# Patient Record
Sex: Female | Born: 1965 | Race: Black or African American | Hispanic: No | Marital: Married | Smoking: Never smoker
Health system: Southern US, Community
[De-identification: ages and names within clinical notes are randomized; demographics above are authoritative.]

## PROBLEM LIST (undated history)

## (undated) DIAGNOSIS — T7840XA Allergy, unspecified, initial encounter: Secondary | ICD-10-CM

## (undated) DIAGNOSIS — L509 Urticaria, unspecified: Secondary | ICD-10-CM

## (undated) HISTORY — DX: Urticaria, unspecified: L50.9

## (undated) HISTORY — DX: Allergy, unspecified, initial encounter: T78.40XA

---

## 1998-11-19 ENCOUNTER — Other Ambulatory Visit: Admission: RE | Admit: 1998-11-19 | Discharge: 1998-11-19 | Payer: Self-pay | Admitting: Obstetrics and Gynecology

## 1999-06-13 ENCOUNTER — Inpatient Hospital Stay (HOSPITAL_COMMUNITY): Admission: AD | Admit: 1999-06-13 | Discharge: 1999-06-16 | Payer: Self-pay | Admitting: Obstetrics and Gynecology

## 1999-06-13 ENCOUNTER — Encounter (INDEPENDENT_AMBULATORY_CARE_PROVIDER_SITE_OTHER): Payer: Self-pay | Admitting: Specialist

## 1999-06-18 ENCOUNTER — Encounter: Admission: RE | Admit: 1999-06-18 | Discharge: 1999-09-16 | Payer: Self-pay | Admitting: Obstetrics and Gynecology

## 1999-08-06 ENCOUNTER — Other Ambulatory Visit: Admission: RE | Admit: 1999-08-06 | Discharge: 1999-08-06 | Payer: Self-pay | Admitting: Obstetrics and Gynecology

## 2001-01-19 ENCOUNTER — Other Ambulatory Visit: Admission: RE | Admit: 2001-01-19 | Discharge: 2001-01-19 | Payer: Self-pay | Admitting: *Deleted

## 2002-04-12 ENCOUNTER — Other Ambulatory Visit: Admission: RE | Admit: 2002-04-12 | Discharge: 2002-04-12 | Payer: Self-pay | Admitting: *Deleted

## 2003-05-06 ENCOUNTER — Other Ambulatory Visit: Admission: RE | Admit: 2003-05-06 | Discharge: 2003-05-06 | Payer: Self-pay | Admitting: *Deleted

## 2004-04-14 ENCOUNTER — Other Ambulatory Visit: Admission: RE | Admit: 2004-04-14 | Discharge: 2004-04-14 | Payer: Self-pay | Admitting: Obstetrics and Gynecology

## 2004-10-02 ENCOUNTER — Encounter (INDEPENDENT_AMBULATORY_CARE_PROVIDER_SITE_OTHER): Payer: Self-pay | Admitting: Specialist

## 2004-10-02 ENCOUNTER — Inpatient Hospital Stay (HOSPITAL_COMMUNITY): Admission: RE | Admit: 2004-10-02 | Discharge: 2004-10-05 | Payer: Self-pay | Admitting: Obstetrics and Gynecology

## 2004-11-12 ENCOUNTER — Other Ambulatory Visit: Admission: RE | Admit: 2004-11-12 | Discharge: 2004-11-12 | Payer: Self-pay | Admitting: Obstetrics and Gynecology

## 2005-02-22 ENCOUNTER — Encounter (HOSPITAL_COMMUNITY): Admission: RE | Admit: 2005-02-22 | Discharge: 2005-03-24 | Payer: Self-pay | Admitting: Obstetrics and Gynecology

## 2005-12-13 ENCOUNTER — Other Ambulatory Visit: Admission: RE | Admit: 2005-12-13 | Discharge: 2005-12-13 | Payer: Self-pay | Admitting: Obstetrics and Gynecology

## 2007-03-13 ENCOUNTER — Other Ambulatory Visit: Admission: RE | Admit: 2007-03-13 | Discharge: 2007-03-13 | Payer: Self-pay | Admitting: Obstetrics and Gynecology

## 2008-11-25 ENCOUNTER — Other Ambulatory Visit: Admission: RE | Admit: 2008-11-25 | Discharge: 2008-11-25 | Payer: Self-pay | Admitting: Obstetrics and Gynecology

## 2008-12-24 ENCOUNTER — Encounter: Admission: RE | Admit: 2008-12-24 | Discharge: 2008-12-24 | Payer: Self-pay | Admitting: Obstetrics and Gynecology

## 2009-10-29 ENCOUNTER — Encounter: Admission: RE | Admit: 2009-10-29 | Discharge: 2009-10-29 | Payer: Self-pay | Admitting: Obstetrics and Gynecology

## 2009-12-23 ENCOUNTER — Other Ambulatory Visit: Admission: RE | Admit: 2009-12-23 | Discharge: 2009-12-23 | Payer: Self-pay | Admitting: Obstetrics and Gynecology

## 2010-02-06 ENCOUNTER — Encounter: Admission: RE | Admit: 2010-02-06 | Discharge: 2010-02-06 | Payer: Self-pay | Admitting: Obstetrics and Gynecology

## 2010-09-18 NOTE — Discharge Summary (Signed)
[facility]  Patient:    [name], [name]                       MRN: [id number] Adm. Date:  [id number] Disc. Date: [id number] Attending:  Madelyn Flavors Dictator:   Danie Chandler, R.N.                           Discharge Summary  ADMISSION DIAGNOSES:          1. Intrauterine pregnancy at term.                               2. For induction of labor.  DISCHARGE DIAGNOSES:          1. Intrauterine pregnancy at term.                               2. For induction of labor.                               3. Fetal intolerance to labor.  PROCEDURE:                    On June 13, 1999, primary low transverse cesarean section.  REASON FOR ADMISSION:         The patient is a 45 year old, gravida 1, para 0, ith an estimated date of confinement of May 29, 1999, placing her at 40+ weeks gestation.  The patient was admitted for induction of labor.  Artificial rupture of membranes was carried out revealing clear fluid.  The fetal heart was reactive, but there were three episodes of deep variables which resolved and there was good variability and accelleration.  The patient was started on Pitocin augmentation and that afternoon around 4:30 the heart rate decreased to the 60s times approximately one minute with recovery and there were good accellerations and variability. Once again around 5:45 p.m. the heart rate decreased for approximately 1-1/2 minutes to the 70s and then times 1 minute to the 70s to 80s.  There was good recovery and the fetal heart was reactive with good accellerations and variability.  The cervix t that time was 3 cm, 70%, and vertex at -2.  It was explained to the patient that it may be necessary to do cesarean section if variables became frequent.  At approximately 9 p.m. the fetal heart rate decreased to the 50s x 2 minutes and hen to the 70s and then recovered fully.  This preceded by three late variables with slow  returns.  It was explained to the patient and the husband that the fetus had done this four times since uterine contractions had begun.  At this time, the cervix was 5 cm, 50%, and vertex -2 to -3.  The decision was made to proceed with primary cesarean section due to the assessment that the fetus could not tolerate labor.  HOSPITAL COURSE:              The patient is taken to the operating room and undergoes the above named procedure without complications.  This is productive f a viable female infant with Apgars of 8 at one minute and 9 at five minutes and an arterial cord pH of 7.24.  Postoperatively, the patient  does well.  On postoperative day #1, the patients hemoglobin was 11.6, hematocrit 33.9, and white blood cell count 14.1.  On postoperative day #2, the patient had good control of pain and was ambulating well without difficulty.  On postoperative day #3, she ad good return of bowel function and was tolerating a regular diet.  She was discharged home on this day.  CONDITION ON DISCHARGE:       Good.  DIET:                         Regular as tolerated.  ACTIVITY:                     No heavy lifting, no driving, and no vaginal entry.  FOLLOW-UP:                    She is to follow up in the office in two weeks for incision check and she is to call for a temperature greater than 100 degrees, persistent nausea and vomiting, heavy vaginal bleeding, and/or redness or drainage from the incision site.  DISCHARGE MEDICATIONS:        1. Prenatal vitamins one p.o. q.d.                               2. Motrin 800 mg one p.o. q.8h. p.r.n. pain.                               3. Tylox #20 one to two p.o. q.4h. p.r.n. pain. DD:  06/29/99 TD:  06/29/99 Job: [id number]

## 2010-09-18 NOTE — H&P (Signed)
NAMEJORDYNE, [name]              ACCOUNT NO.:  [id number]   MEDICAL RECORD NO.:  [id number]          PATIENT TYPE:  INP   LOCATION:  NA                            FACILITY:  [facility]   PHYSICIAN:  Guy Sandifer. Henderson Cloud, M.D. DATE OF BIRTH:  May 08, 1965   DATE OF ADMISSION:  DATE OF DISCHARGE:                                HISTORY & PHYSICAL   DATE OF ADMISSION:  October 02, 2004   CHIEF COMPLAINT:  Desires repeat cesarean section and tubal ligation.   HISTORY OF PRESENT ILLNESS:  This patient is a 45 year old married African-  American female G2 P1 with an EDC of October 11, 2004 established by a 14-week  ultrasound. Has a history of a previous cesarean section. After  consideration of the options, she desires repeat. She also desires tubal  ligation. Risks of surgery as well as permanence, risk of failure, and  increased ectopic risk of tubal ligation have been reviewed.   Past medical history, past surgical history, family history, social history,  obstetric history per prenatal history and physical.   MEDICATIONS:  Prenatal vitamins. No known drug allergies.   REVIEW OF SYSTEMS:  NEURO:  Denies headache.  CARDIO:  Denies chest pain.  PULMONARY: Denies shortness of breath. GI:  Denies recent changes in bowel  habits.   PHYSICAL EXAMINATION:  VITAL SIGNS:  Height 5 feet 7 inches, weight 160  pounds, blood pressure 116/68.  HEENT:  Without thyromegaly.  LUNGS:  Clear to auscultation.  HEART:  Regular rate and rhythm.  BACK:  Without CVA tenderness.  BREASTS:  Not examined.  ABDOMEN:  Gravid, nontender.  PELVIC:  Cervix is 1 cm dilated, thick, vertex, and ballotable.  EXTREMITIES AND NEUROLOGIC:  Grossly within normal limits.   ASSESSMENT:  Previous cesarean section.   PLAN:  Repeat cesarean section and bilateral tubal ligation.      JET/MEDQ  D:  10/01/2004  T:  10/01/2004  Job:  [id number]

## 2010-09-18 NOTE — Op Note (Signed)
[name], [name]              ACCOUNT NO.:  [id number]   MEDICAL RECORD NO.:  [id number]          PATIENT TYPE:  INP   LOCATION:  9145                          [facility]ACILITY[facility]  [facility]   PHYSICIAN:  Guy Sandifer. Henderson Cloud, M.D. DATE OF BIRTH:  09-28-1965   DATE OF PROCEDURE:  10/02/2004  DATE OF DISCHARGE:                                 OPERATIVE REPORT   PREOPERATIVE DIAGNOSES:  1.  Intrauterine pregnancy at 38-5/7 weeks estimated gestational age.  2.  Previous cesarean section, desires repeat.  3.  Desires permanent sterilization.   POSTOPERATIVE DIAGNOSES:  1.  Intrauterine pregnancy at 38-5/7 weeks estimated gestational age.  2.  Previous cesarean section, desires repeat.  3.  Desires permanent sterilization.   PROCEDURE:  1.  Repeat low transverse cesarean section.  2.  Bilateral tubal ligation.   SURGEON:  Harold Hedge, MD   ANESTHESIA:  Spinal, Leilani Able, MD   ESTIMATED BLOOD LOSS:  800 mL.   SPECIMENS:  Placenta.   FINDINGS:  Viable female infant, Apgars of 9 and 9 at one and five minutes.  Birth weight 7 pounds 10 ounces, arterial cord pH 7.30.   INDICATIONS AND CONSENT:  This patient is a 45 year old married African-  American female, G2, [place], with an EDC of October 11, 2004.  Prenatal care was  complicated by history of cesarean section.  She desires repeat.  Desires  permanent sterilization.  Alternate methods of contraception have been  discussed.  Potential risks and complications have been reviewed  preoperatively, including but not limited to, infection; bowel, bladder,  ureteral damage; bleeding requiring transfusion of blood products with  possible transfusion reaction; HIV and hepatitis acquisition; DVT, PE,  pneumonia.  Permanence of the tubal failure rate, increased ectopic risk  have all been reviewed.  All questions are answered, and consent is signed  on the chart.   DESCRIPTION OF PROCEDURE:  The patient is taken to the operating room where  she is  identified; spinal anesthetic is placed, and she is placed in the  dorsal supine position with a 15-degree left lateral wedge.  She is then  prepped abdominally.  Foley catheter is placed, and the bladder is drained,  and she is draped in a sterile fashion.  After testing for adequate spinal  anesthesia, skin is entered through a Pfannenstiel incision, taking the old  scar out on the way in.  Dissection is carried out in layers to the  peritoneum which is incised and extended superiorly and inferiorly.  Vesicouterine peritoneum is taken down cephalolaterally.  The bladder flap  is developed, and the bladder blade is placed.  The uterus is then excised  in a low transverse manner.  The uterine cavity is entered bluntly with a  hemostat, and the uterine incision is extended cephalolaterally with the  fingers.  Artificial rupture of membranes where clear fluid is noted.  The  vertex is delivered.  A loose nuchal cord x 1 is reduced.  The baby is  delivered with good cry and tone.  Cord is clamped and cut, and the baby is  handed to the awaiting  pediatrics team.  A true knot is noted in the  umbilical cord.  Placenta is manually delivered and sent to pathology.  Uterine cavity is cleaned.  Uterus is closed in two running locking  imbricating layers with 0 Monocryl suture.  A single figure-of-eight of 0  Monocryl is placed in the middle of the incision to obtain complete  hemostasis.  Ovaries are normal bilaterally.  The left fallopian tube is  identified from cornu to fimbria, grasped at its mid ampullary portion with  a Babcock clamp.  A knuckle of tube is then ligated doubly with two free  ties of 0 plain suture.  The intervening knuckle was then sharply resected.  Cautery is used to obtain completely hemostasis.  A similar procedure is  carried out on the right side.  Anterior peritoneum is closed in a running  fashion with 0 Monocryl suture which is also used to reapproximate the   pyramidalis muscle in the midline.  Anterior rectus fascia is closed in  running fashion with 0 PDS suture.  The skin is closed with subcuticular 4-0  Vicryl suture.  Steri-Strips and dressings are applied.  All counts correct.  The patient is taken to the recovery room in stable condition.      JET/MEDQ  D:  10/02/2004  T:  10/03/2004  Job:  [id number]

## 2010-09-18 NOTE — Op Note (Signed)
[facility]  Patient:    [name], [name]                     MRN: [id number] Proc. Date: 06/13/99 Adm. Date:  [id number] Attending:  Madelyn Flavors                           Operative Report  PREOPERATIVE DIAGNOSIS:       Fetal intolerance to labor.  POSTOPERATIVE DIAGNOSIS:      Fetal intolerance to labor.  OPERATION:                    Primary cesarean section, low cervical transverse.  SURGEON:                      Beather Arbour. Thomasena Edis, M.D.  ASSISTANT:                    Bernette Redbird, M.D.  ANESTHESIA:                   Spinal.  ESTIMATED BLOOD LOSS:         600 cc.  FLUIDS:                       2500 cc of crystalloid and 1 gram of Ancef at cord clamp.  DRAINS:                       Foley.  COMPLICATIONS:                None.  DESCRIPTION OF PROCEDURE:     The patient was brought to the operating room and  identified on the operating table.  After induction of adequate spinal anesthesia, the patient was placed in the left uterine displacement position and prepped and draped in the usual sterile fashion.  A Foley catheter had been previously placed. A Pfannenstiel incision was made and carried down to the fascia.  The fascia was scored in the midline and extended bilaterally using Mayo scissors.  It was then separated free from the underlying muscle.  The muscles were separated in the midline down to the symphysis.  The peritoneum was entered sharply and carefully taking care to avoid bowel or other abdominal contents.  The peritoneal incision was extended superiorly and inferiorly down to the bladder edge.  The bladder blade was placed and the bladder flap was developed.  The uterus was scored in the lower uterine segment and the incision was extended in a curvilinear fashion using bandage scissors.  All instruments were removed from the operative field.  The fetal vertex was delivered without difficulty onto the  operative field and the infant was bulb suctioned.  There was noted to be no nuchal cord.  The remainder of the infants body was then delivered onto the operative field.  The cord was doubly clamped and cut and the baby was handed to the awaiting neonatal resuscitation team. Cord pH was sent and found to be 7.24.  Cord bloods were collected.  The placenta was allowed to deliver spontaneously and was sent to pathology for examination.  Due to the prolonged fetal decellerations.  There was noted to be  normal Whartons jelly and no etiology found for the prolonged decellerations. he uterus was wiped clean of remaining products of conception.  The bladder blade as replaced  and the uterus was closed using a simple running interlocking suture of 0 Monocryl.  There was noted to be a small, approximately 2.5 cm cervical extension on the left which was closed prior to the closing of the uterine incision using a running interlocking suture of 0 Monocryl.  A suture of 0 Monocryl was then placed in a figure-of-eight fashion in areas that were in need of hemostasis.  The uterine incision was irrigated copiously with warm Ringers lactate and excellent hemostasis was noted at the uterine incision.  The bladder flap was closed using a simple running suture of 2-0 Monocryl.  The gutters were irrigated free of clots. The uterus, tubes, and ovaries appeared grossly normal.  Naydelin clamps were placed on the peritoneum and the peritoneum was closed using a simple running suture of 2-0 Monocryl.  The subfascial areas were examined for areas of hemostasis and excellent hemostasis was achieved using cautery.  Again, all subfascial areas were noted to be very hemostatic.  The muscles were tacked together in the midline overlying the bladder.  The fascia was closed using a suture of 0 PDS with the suture anchored at the leftmost angle of the incision, run to the rightmost angle of the incision and tied.   The subcutaneous tissue was irrigated copiously with  warm Ringers lactate and excellent hemostasis achieved using cautery.  The patient had requested no staples for skin closure and a subcuticular closure was performed using a suture of 4-0 undyed Vicryl.  Steri-Strips were applied.  The patient tolerated the procedure well without apparent complications and was transferred to the recovery room in stable condition after all instrument, sponge, and needle counts were correct.  The baby was found to be a viable female, Apgars 8 and 9,  cord pH 7.24, and went to the normal newborn nursery.  The baby was found to weigh 7 pounds 5 ounces.  The placenta was sent to pathology due to the fetal decellerations. DD:  06/13/99 TD:  06/15/99 Job: [id number]

## 2010-09-18 NOTE — Discharge Summary (Signed)
[name], [name]              ACCOUNT NO.:  [id number]   MEDICAL RECORD NO.:  [id number]          PATIENT TYPE:  INP   LOCATION:  9145                          FACILITY:  [facility]   PHYSICIAN:  Guy Sandifer. Henderson Cloud, M.D. DATE OF BIRTH:  [birth year]   DATE OF ADMISSION:  10/02/2004  DATE OF DISCHARGE:  10/05/2004                                 DISCHARGE SUMMARY   ADMITTING DIAGNOSES:  1.  Intrauterine pregnancy at 70 and five-sevenths weeks estimated      gestational age.  2.  Previous cesarean section, desires repeat.  3.  Multiparity, desires permanent sterilization.   DISCHARGE DIAGNOSES:  1.  Status post low transverse cesarean section.  2.  Viable female infant.  3.  Permanent sterilization.   PROCEDURE:  1.  Repeat low transverse cesarean section.  2.  Bilateral tubal ligation.   REASON FOR ADMISSION:  Please see dictated H&P.   HOSPITAL COURSE:  The patient was a 45 year old African-American married  female gravida 2 para 1 that was admitted to [facility] for  a scheduled cesarean section. Due to multiparity, the patient had also  requested permanent sterilization. On the morning of admission, the patient  was taken to the operating room where spinal anesthesia was administered  without difficulty. A low transverse incision was made with the delivery of  a viable female infant weighing 7 pounds 10 ounces with Apgars of 9 at one  minute and 9 at five minutes. Arterial cord pH was 7.30. Bilateral tubal  ligation was performed without difficulty. The patient tolerated the  procedure and was taken to the recovery room in stable condition. On  postoperative day #1, the patient was without complaint other than some  itching. Vital signs were stable. She was afebrile. Abdomen was soft. Fundus  was firm and nontender. Abdominal dressing was noted to be clean, dry and  intact. On postoperative day #2, the patient was doing well. Vital signs  were stable. Abdomen was  soft. Fundus was firm. Abdominal dressing had been  removed revealing an incision that was clean, dry and intact. The patient  was ambulating well. On postoperative day #3, the patient was without  complaint other than some itching associated with Percocet. Vital signs were  stable. Abdomen was soft. Fundus was firm and nontender. Incision was clean,  dry and intact with subcuticular repair. She was ambulating well. Discharge  instructions reviewed and the patient was discharged home.   CONDITION ON DISCHARGE:  Good.   DIET:  Regular as tolerated.   ACTIVITY:  No heavy lifting, no driving x2 weeks, no vaginal entry.   FOLLOW-UP:  The patient is to follow up in the office in 2 weeks for an  incision check. She is to call for temperature greater than 100 degrees,  persistent nausea and vomiting, heavy vaginal bleeding, and/or redness or  drainage from the incisional site.   DISCHARGE MEDICATIONS:  1.  Vicodin #20 one p.o. q.4-6h. p.r.n.  2.  Darvocet #30 if needed for less severe pain one p.o. q.4-6h. p.r.n.  3.  Motrin 600 mg every 6 hours.  4.  Prenatal vitamins one p.o. daily.  5.  Colace one p.o. daily p.r.n.       CC/MEDQ  D:  10/29/2004  T:  10/29/2004  Job:  [id number]

## 2011-02-09 ENCOUNTER — Other Ambulatory Visit (HOSPITAL_COMMUNITY)
Admission: RE | Admit: 2011-02-09 | Discharge: 2011-02-09 | Disposition: A | Discharge status: Home / Self Care | Payer: BC Managed Care – PPO | Source: Ambulatory Visit | Attending: Obstetrics and Gynecology | Admitting: Obstetrics and Gynecology

## 2011-02-09 ENCOUNTER — Other Ambulatory Visit: Payer: Self-pay | Admitting: Obstetrics and Gynecology

## 2011-02-09 DIAGNOSIS — Z01419 Encounter for gynecological examination (general) (routine) without abnormal findings: Secondary | ICD-10-CM | POA: Insufficient documentation

## 2011-02-24 ENCOUNTER — Other Ambulatory Visit: Payer: Self-pay | Admitting: Obstetrics and Gynecology

## 2011-02-24 DIAGNOSIS — Z1231 Encounter for screening mammogram for malignant neoplasm of breast: Secondary | ICD-10-CM

## 2011-03-09 ENCOUNTER — Ambulatory Visit
Admission: RE | Admit: 2011-03-09 | Discharge: 2011-03-09 | Disposition: A | Discharge status: Home / Self Care | Payer: BC Managed Care – PPO | Source: Ambulatory Visit | Attending: Obstetrics and Gynecology | Admitting: Obstetrics and Gynecology

## 2011-03-09 DIAGNOSIS — Z1231 Encounter for screening mammogram for malignant neoplasm of breast: Secondary | ICD-10-CM

## 2012-08-31 ENCOUNTER — Other Ambulatory Visit: Payer: Self-pay

## 2012-08-31 DIAGNOSIS — Z1231 Encounter for screening mammogram for malignant neoplasm of breast: Secondary | ICD-10-CM

## 2012-10-16 ENCOUNTER — Ambulatory Visit
Admission: RE | Admit: 2012-10-16 | Discharge: 2012-10-16 | Disposition: A | Discharge status: Home / Self Care | Payer: BC Managed Care – PPO | Source: Ambulatory Visit

## 2012-10-16 DIAGNOSIS — Z1231 Encounter for screening mammogram for malignant neoplasm of breast: Secondary | ICD-10-CM

## 2013-11-01 ENCOUNTER — Other Ambulatory Visit: Payer: Self-pay

## 2013-11-01 DIAGNOSIS — Z1231 Encounter for screening mammogram for malignant neoplasm of breast: Secondary | ICD-10-CM

## 2013-11-06 ENCOUNTER — Encounter (INDEPENDENT_AMBULATORY_CARE_PROVIDER_SITE_OTHER): Payer: Self-pay

## 2013-11-06 ENCOUNTER — Ambulatory Visit
Admission: RE | Admit: 2013-11-06 | Discharge: 2013-11-06 | Disposition: A | Discharge status: Home / Self Care | Payer: BC Managed Care – PPO | Source: Ambulatory Visit

## 2013-11-06 DIAGNOSIS — Z1231 Encounter for screening mammogram for malignant neoplasm of breast: Secondary | ICD-10-CM

## 2013-11-07 ENCOUNTER — Encounter: Payer: Self-pay | Admitting: Sports Medicine

## 2013-11-07 ENCOUNTER — Ambulatory Visit (INDEPENDENT_AMBULATORY_CARE_PROVIDER_SITE_OTHER): Payer: BC Managed Care – PPO | Admitting: Sports Medicine

## 2013-11-07 VITALS — BP 106/60 | Ht 67.0 in | Wt 123.0 lb

## 2013-11-07 DIAGNOSIS — R001 Bradycardia, unspecified: Secondary | ICD-10-CM | POA: Insufficient documentation

## 2013-11-07 DIAGNOSIS — I498 Other specified cardiac arrhythmias: Secondary | ICD-10-CM

## 2013-11-07 DIAGNOSIS — R5383 Other fatigue: Secondary | ICD-10-CM

## 2013-11-07 DIAGNOSIS — R5381 Other malaise: Secondary | ICD-10-CM

## 2013-11-07 NOTE — Assessment & Plan Note (Signed)
Check for the possibility of low ferritin, and anemia or thyroid disease  We will also check a chemical panel

## 2013-11-07 NOTE — Progress Notes (Addendum)
Patient ID: Sara FlowersKelly Franco, female   DOB: 13-Apr-1966, 48 y.o.   MRN: [id number]  Hx of fatigue  First worked up by Dr Garnette ScheuermannHank Smith with chest pain and fatigue in 2013 Workup basically revealed chronotropic incompetence but no other specific findings Presyncope then but never had that again ETT showed multifocal atrial pacemaker/ bradycardia However this was not a maximal ETT  No Hx of anemia or thyroid disease No hx of asthma No GERD  Menstruation is regular and is heavy x 2 days / lasts 5 to 7 days- even went back on OCPs for awhile to control bleeding  Now off OCPs Takes a multivitamin  Diet - eats fruits / vegetables and fruit/ does eat red meat but not a lot  Recently fatigues with 1 hour of tennis  Heavy sweater with exercise  No illnesses or chronic disease history  Examination No acute distress/ thin and athletic BP 106/60  Ht 5\' 7"  (1.702 m)  Wt 123 lb (55.792 kg)  BMI 19.26 kg/m2  LMP 10/28/2013  Pulse only 40  Lungs clear coronary regular rhythm with bradycardia but no murmur or gallop Abdomen nontender with no hepatosplenomegaly Normal lymph nodes  EKG Abnormal appearing P wave Normal axis QRS enlargement of her lateral leads Rate of only 40   Addendum:  After reviewing labs we found ferritin of 7 and early parameters of iron deficiency anemia.  HGB is still 12.  For sports she probably has relatively severe total iron depletion so we will start FeSO4 325 mgm tid x 1 month followed by bid.  Reck ferritin and HGB in 3 mos.

## 2013-11-07 NOTE — Assessment & Plan Note (Signed)
With ongoing symptoms I think we need to go ahead and do a maximal stress test  This is scheduled and pending results we may need to consult a rhythm specialist

## 2013-11-08 LAB — COMPREHENSIVE METABOLIC PANEL
ALT: 19 U/L (ref 0–35)
AST: 25 U/L (ref 0–37)
Albumin: 4.3 g/dL (ref 3.5–5.2)
Alkaline Phosphatase: 65 U/L (ref 39–117)
BUN: 9 mg/dL (ref 6–23)
CALCIUM: 9 mg/dL (ref 8.4–10.5)
CHLORIDE: 101 meq/L (ref 96–112)
CO2: 28 meq/L (ref 19–32)
CREATININE: 0.71 mg/dL (ref 0.50–1.10)
GLUCOSE: 78 mg/dL (ref 70–99)
Potassium: 3.8 mEq/L (ref 3.5–5.3)
Sodium: 138 mEq/L (ref 135–145)
Total Bilirubin: 0.5 mg/dL (ref 0.2–1.2)
Total Protein: 7.2 g/dL (ref 6.0–8.3)

## 2013-11-08 LAB — CBC
HEMATOCRIT: 36.4 % (ref 36.0–46.0)
HEMOGLOBIN: 12 g/dL (ref 12.0–15.0)
MCH: 25.7 pg — ABNORMAL LOW (ref 26.0–34.0)
MCHC: 33 g/dL (ref 30.0–36.0)
MCV: 77.9 fL — AB (ref 78.0–100.0)
PLATELETS: 343 10*3/uL (ref 150–400)
RBC: 4.67 MIL/uL (ref 3.87–5.11)
RDW: 15.9 % — ABNORMAL HIGH (ref 11.5–15.5)
WBC: 7.9 10*3/uL (ref 4.0–10.5)

## 2013-11-08 LAB — TSH: TSH: 1.47 u[IU]/mL (ref 0.350–4.500)

## 2013-11-08 LAB — FERRITIN: Ferritin: 7 ng/mL — ABNORMAL LOW (ref 10–291)

## 2013-11-12 ENCOUNTER — Encounter: Payer: Self-pay | Admitting: Sports Medicine

## 2013-11-16 ENCOUNTER — Encounter (HOSPITAL_COMMUNITY): Payer: BC Managed Care – PPO

## 2014-03-21 ENCOUNTER — Other Ambulatory Visit: Payer: Self-pay | Admitting: *Deleted

## 2014-03-21 DIAGNOSIS — R5381 Other malaise: Secondary | ICD-10-CM

## 2014-03-21 DIAGNOSIS — R5383 Other fatigue: Principal | ICD-10-CM

## 2014-03-25 ENCOUNTER — Other Ambulatory Visit: Payer: BC Managed Care – PPO

## 2014-03-25 DIAGNOSIS — R5381 Other malaise: Secondary | ICD-10-CM

## 2014-03-25 DIAGNOSIS — R5383 Other fatigue: Principal | ICD-10-CM

## 2014-03-25 LAB — FERRITIN: FERRITIN: 26 ng/mL (ref 10–291)

## 2014-03-27 LAB — HEMOGLOBIN

## 2014-04-03 ENCOUNTER — Encounter: Payer: Self-pay | Admitting: Sports Medicine

## 2014-04-03 ENCOUNTER — Ambulatory Visit (INDEPENDENT_AMBULATORY_CARE_PROVIDER_SITE_OTHER): Payer: BC Managed Care – PPO | Admitting: Sports Medicine

## 2014-04-03 VITALS — BP 101/71 | HR 42 | Ht 67.0 in | Wt 123.0 lb

## 2014-04-03 DIAGNOSIS — R001 Bradycardia, unspecified: Secondary | ICD-10-CM | POA: Diagnosis not present

## 2014-04-03 NOTE — Assessment & Plan Note (Signed)
-  We believe that she was reaching her maximal effort with the peak flows during the step testing both pre-and post. -A modified exercise peak flow test reveals that she still has reduced peak flow at rest, but this does not fall off significantly and in fact increases during exercise. -We believe that her condition is more likely related to extrinsic pulmonary cause such as slight weakness in her accessory respiratory muscles versus an underlying cardiac condition. We do not see signs of exercise-induced bronchospasm. -She will begin strengthening exercises for her accessory muscles of respiration with light weights holding her arms abducted for 15 tablets x3 reps daily -After the New Year we would like to have her undergo treadmill stress testing to rule out underlying cardiac etiology.

## 2014-04-03 NOTE — Progress Notes (Signed)
   Subjective:    Patient ID: Sara FlowersKelly Franco, female    DOB: [birth year], 48 y.o.   MRN: [id number]  HPI Sara EndoKelly is a 48 year old female who presents for followup of shortness of breath. She initially was found to have a ferritin of 7, this was since increased to 26 with iron supplementation.  This was possibly charted sometime last year when she was adhering to a more vegetarian diet. She remains on the daily iron supplement.  There was also the thought that her symptoms could have possibly been triggered by bradycardia, as her resting heart rate was in the 30s to 40s.  During exercise she denies any coughing, wheezing, chest pain, or throat tenderness.  She denies any allergy symptoms.  Past medical history, social history, medications, and allergies were reviewed and are up to date in the chart.  Review of Systems 7 point review of systems was performed and was otherwise negative unless noted in the history of present illness.     Objective:   Physical Exam BP 101/71 mmHg  Pulse 42  Ht 5\' 7"  (1.702 m)  Wt 123 lb (55.792 kg)  BMI 19.26 kg/m2 GEN: The patient is well-developed well-nourished female and in no acute distress.  She is awake alert and oriented x3. SKIN: warm and well-perfused, no rash  CV: Bradycardia, regular rhythm, no murmurs, rubs, or gallops. Vasc: +2 bilateral distal pulses. No edema.   Step Testing: Pre-peak flow: 250 Pre-pulse: 42 Post-exercise PF: 250, 230, 240 (at 0, 3, 6 minutes) Post-exercise pulse: 81, 57, 54 (at 0, 3, 5 minutes)  Modified exercise testing: Rest peak flow: 400 After exercise: 400 1 minute after exercise: 420    Assessment & Plan:  Please see problem based assessment and plan in the problem list.

## 2014-11-05 ENCOUNTER — Other Ambulatory Visit: Payer: Self-pay | Admitting: *Deleted

## 2014-11-05 DIAGNOSIS — R001 Bradycardia, unspecified: Secondary | ICD-10-CM

## 2014-11-05 DIAGNOSIS — T733XXD Exhaustion due to excessive exertion, subsequent encounter: Secondary | ICD-10-CM

## 2014-11-13 ENCOUNTER — Other Ambulatory Visit (INDEPENDENT_AMBULATORY_CARE_PROVIDER_SITE_OTHER): Payer: BLUE CROSS/BLUE SHIELD

## 2014-11-13 DIAGNOSIS — R001 Bradycardia, unspecified: Secondary | ICD-10-CM

## 2014-11-13 DIAGNOSIS — T733XXD Exhaustion due to excessive exertion, subsequent encounter: Secondary | ICD-10-CM

## 2014-11-13 LAB — FERRITIN: Ferritin: 25 ng/mL (ref 10–291)

## 2014-11-13 NOTE — Progress Notes (Signed)
FERRITIN DONE TODAY Ikia Cincotta

## 2014-11-15 ENCOUNTER — Encounter (HOSPITAL_COMMUNITY): Payer: Self-pay

## 2014-11-15 ENCOUNTER — Ambulatory Visit (HOSPITAL_COMMUNITY)
Admission: RE | Admit: 2014-11-15 | Discharge: 2014-11-15 | Disposition: A | Discharge status: Home / Self Care | Payer: BLUE CROSS/BLUE SHIELD | Source: Ambulatory Visit | Attending: Sports Medicine | Admitting: Sports Medicine

## 2014-11-15 ENCOUNTER — Ambulatory Visit (HOSPITAL_BASED_OUTPATIENT_CLINIC_OR_DEPARTMENT_OTHER): Payer: BLUE CROSS/BLUE SHIELD | Admitting: Sports Medicine

## 2014-11-15 DIAGNOSIS — D509 Iron deficiency anemia, unspecified: Secondary | ICD-10-CM

## 2014-11-15 DIAGNOSIS — R001 Bradycardia, unspecified: Secondary | ICD-10-CM | POA: Insufficient documentation

## 2014-11-15 DIAGNOSIS — E611 Iron deficiency: Secondary | ICD-10-CM

## 2014-11-15 LAB — EXERCISE TOLERANCE TEST
CHL CUP MPHR: 174 {beats}/min
CSEPEW: 17.4 METS
CSEPPHR: 166 {beats}/min

## 2014-11-15 NOTE — Progress Notes (Signed)
Patient ID: [name], female   DOB: 10/15/1965, 49 y.o.   MRN: [id number]  The Graded Exercise Test was negative for signs of ischemia. The patient achieved and exercise level of  17.4 mets. Achieved maximal  heart rate  was  166 vs predicted max of 174 85% - 90% Maximal  Heart Rate was : 147  Pretest Likelihood that this patient has coronary artery disease (CAD), given their symptoms and risk profile was estimated to be  < 5% Post test likelihood of this patient having CAD was estimated to be  < 1%.  TEST SPECIFICS: Maximum heart rate achieved  166 Exercise effort  excellent Symptoms with exercise?  None until maximum exercise Blood pressure response to exercise?  188/90 - normal rise EKG response to exercise: Upsloping ST change  COMMENTS:  Patient was tested because of fatigue and sense of palpitations during maximum exercise.  Family history is unknown but no other major risk factors. She completed 14 mins and 45 secs of Bruce protocol.  This is a high performance/ fitness level.  Est Vo2Max ~ 60. HRR recovery 16 beats at 1 min BP recovery was rapid in < 41mns.  No rhythm disturbance noted.  Negative test

## 2014-12-04 ENCOUNTER — Ambulatory Visit (INDEPENDENT_AMBULATORY_CARE_PROVIDER_SITE_OTHER): Payer: BLUE CROSS/BLUE SHIELD | Admitting: Family Medicine

## 2014-12-04 ENCOUNTER — Encounter: Payer: Self-pay | Admitting: Family Medicine

## 2014-12-04 VITALS — Ht 67.0 in | Wt 128.0 lb

## 2014-12-04 DIAGNOSIS — R5383 Other fatigue: Secondary | ICD-10-CM | POA: Diagnosis not present

## 2014-12-04 NOTE — Patient Instructions (Addendum)
-   Iron supplement: Take on an empty stomach with vitamin C (500 mg), starting with once a day only.    - If this is well tolerated, you can go to twice a day.   - Increase red meat intake some, i.e., at least once a week. - Eat at least 3 meals and 1-2 snacks per day.  Aim for no more than 5 hours between eating.  Eat breakfast within one hour of getting up.  - Following EVERY workout, make sure you get a snack that provides both carbohydrate and protein, with at least 200 calories.    - Aim for 4:1 ratio of carb:protein following aerobic workout of at least 45 min.  - Aim for ~3:1 ratio following strength training.    - Protein drink options:  Choc (or strawberry) milk: At least 12 oz milk + 1 1/2 servings of [place] = 12 g protein and 39 g carb.         1 oz (2 tbsp) Pro-Stat protein supplement mixed in 10 oz [employer] 365 brand lemonade, diluted to 20-22 oz with water.        - You could try mixing the Pro-Stat into Pedialyte as well.  The best carb concentration will be 6-10% carb.        - 1 cup = 236 mL.  Divide g of carb by total mL of fluid = % carb.        - For example:  20 oz = 590 mL; Divide 47 g of carb by 590 = 8% carb  - Try to plan ahead for meals/snacks/drinks AND when you feel hunger, be sure to EAT!  - Nutrition Action Healthletter, published by [employer] (CSPI).

## 2014-12-04 NOTE — Progress Notes (Signed)
Medical Nutrition Therapy:  Appt start time: 1330 end time:  1430.  Assessment:  Primary concerns today: iron status.  [name] experienced fatigue and poor exercise recovery in training for the marathon she ran last Nov.  She started taking an Fe supplement about a year ago, but has still only achieved a ferritin level of 25 ng/mL.  She has been taking 2-3 ferrous sulfates of 325 mg each, usually trying to take with meat.  Based on [facility] description of her symptoms (fatigue, feeling down, thinning hair, and extreme difficulty tolerating exercise progression, some weight loss [although minor]), I suspect she was also experiencing a pretty significant energy deficit during the time of marathon training.    Usual eating pattern includes 3 meals and 2-3 snacks per day. Frequent foods and beverages include water (sometimes will has a phase of iced tea/ Kool-Aid), 1 c 2% milk/day, salads, bread, nuts (almonds usually), fruit, veg's, homemade smoothie (van yogurt, fruit, water, vitamin powder) daily.  Avoided foods include okra, lima beans, seafood & fish.  Usually gets red meat only about once every 3 wks.   Usual physical activity includes 2-3 hrs tennis training 2 X wk, 1-3 hr tennis lesson/play.  Is just starting ~30-45 min strength training & stretching 1-2 X wk.  By Sept, she will be doing 3 days strength training, 1-2 days of tennis, and some speed work and agility.  Main training objective at this time is improving her tennis game.    24-hr recall: (Up at 5:15 AM) B (5:45 AM)-   1 smoothie (included 4 oz yogurt, 1 banana, 3/4 c peaches), 1 slc toast w/ 1 tsp butter, 1/4 c almonds, 1 c fruit juice Snk (6:15)-   1/2 c almonds L (2:30 PM)-  3 oz hamburger, 1 roll, water, 16 oz Kool-Aid Snk ( PM)-  --- D (7 PM)-  1 large salad, drsng, 3 oz hamburger, 1 slc toast, 1 tsp butter Snk (9 PM)-  2 c  watermelon Typical day? No.  More typical is lunch of leftover dinner, often with no meat; dinner is usually  meat, carb, and veg.    Progress Towards Goal(s):  In progress.   Nutritional Diagnosis:  NI-5.10.1 Inadequate mineral intake (specify): iron As related to low dietary intake.  As evidenced by ferritin of 25 following months of supplementation.    Intervention:  Nutrition education.  Handouts given during visit include:  AVS  Handouts on iron supplement and on food sources of iron  Demonstrated degree of understanding via:  Teach Back   Monitoring/Evaluation:  Dietary intake, exercise, and body weight prn.

## 2014-12-09 ENCOUNTER — Other Ambulatory Visit: Payer: Self-pay | Admitting: Obstetrics and Gynecology

## 2014-12-10 LAB — CYTOLOGY - PAP

## 2015-02-24 ENCOUNTER — Other Ambulatory Visit: Payer: Self-pay | Admitting: *Deleted

## 2015-02-24 DIAGNOSIS — E611 Iron deficiency: Secondary | ICD-10-CM

## 2015-02-28 ENCOUNTER — Other Ambulatory Visit: Payer: Self-pay | Admitting: Sports Medicine

## 2015-02-28 LAB — FERRITIN: Ferritin: 35 ng/mL (ref 10–291)

## 2015-09-16 ENCOUNTER — Other Ambulatory Visit: Payer: Self-pay | Admitting: *Deleted

## 2015-09-16 DIAGNOSIS — R001 Bradycardia, unspecified: Secondary | ICD-10-CM

## 2015-09-16 DIAGNOSIS — R5383 Other fatigue: Secondary | ICD-10-CM

## 2015-09-19 ENCOUNTER — Other Ambulatory Visit: Payer: Self-pay | Admitting: Sports Medicine

## 2015-09-19 LAB — FERRITIN: Ferritin: 36 ng/mL (ref 10–232)

## 2016-10-04 ENCOUNTER — Other Ambulatory Visit: Payer: Self-pay | Admitting: *Deleted

## 2016-10-04 DIAGNOSIS — R5383 Other fatigue: Secondary | ICD-10-CM

## 2016-10-07 ENCOUNTER — Other Ambulatory Visit: Payer: BLUE CROSS/BLUE SHIELD

## 2016-10-07 DIAGNOSIS — R5383 Other fatigue: Secondary | ICD-10-CM

## 2016-10-08 LAB — FERRITIN: Ferritin: 71 ng/mL (ref 15–150)

## 2016-11-30 ENCOUNTER — Ambulatory Visit (INDEPENDENT_AMBULATORY_CARE_PROVIDER_SITE_OTHER): Payer: BLUE CROSS/BLUE SHIELD | Admitting: Sports Medicine

## 2016-11-30 ENCOUNTER — Encounter: Payer: Self-pay | Admitting: Sports Medicine

## 2016-11-30 ENCOUNTER — Ambulatory Visit: Payer: Self-pay

## 2016-11-30 VITALS — BP 100/70 | Ht 67.0 in | Wt 125.0 lb

## 2016-11-30 DIAGNOSIS — M722 Plantar fascial fibromatosis: Secondary | ICD-10-CM

## 2016-11-30 DIAGNOSIS — M21619 Bunion of unspecified foot: Secondary | ICD-10-CM

## 2016-11-30 DIAGNOSIS — M79671 Pain in right foot: Secondary | ICD-10-CM

## 2016-11-30 NOTE — Assessment & Plan Note (Signed)
Use spacer  Likely will need custom orthotic with MT pad

## 2016-11-30 NOTE — Assessment & Plan Note (Signed)
Arch strap Custom orthotics (made by Dr Althea CharonMckinley) PF stretch reviewed Calf raise  Icing  Modify activity if causing her to limp  Reck if not resolving over next 2 to 3 mos

## 2016-11-30 NOTE — Progress Notes (Signed)
RT Heel pain  Patient with  5 weeks of RT heel pain Not too painful until played tennis tournament in late June After this she had first step morining pain Difficult to walk until doing stretches After she is active pain becomes less No nocturnal pain  Husband is physician and gave her night splint Past 4 days that seems to have helped some  No specific injury  Past Hx - some early bunino formation  ROS No numbness No sciatic type pain  PE Fit, thin B F in NAD BP 100/70   Ht 5\' 7"  (1.702 m)   Wt 125 lb (56.7 kg)   BMI 19.58 kg/m   RT heal very TTP at insertion of PF No visible swelling Great toe with 30 deg valgus shift No bony abnormality of MTP 1 Good motion of great toe Moderate loss of long arch Transverse arch is flat on RT with widening and early bunion and bunionette change  Lt Great toe with 15 deg valgus shift Loss of long and transverse arch but not as much as RT  US of RT heel PF measures 0.66 at insertion Some hypoechoic change 2 cm distal still measures 0.27 cm (should be 0.20)  Impression - plantar fascial thickening consistent with plantar fasciitis and chronic arch strain  Ultrasound and interpretation by Sibyl ParrKarl B. Darrick PennaFields, MD

## 2016-12-02 ENCOUNTER — Ambulatory Visit: Payer: BLUE CROSS/BLUE SHIELD | Admitting: Sports Medicine

## 2016-12-03 ENCOUNTER — Ambulatory Visit: Payer: BLUE CROSS/BLUE SHIELD | Admitting: Sports Medicine

## 2017-03-03 DIAGNOSIS — L658 Other specified nonscarring hair loss: Secondary | ICD-10-CM | POA: Insufficient documentation

## 2017-03-03 DIAGNOSIS — L299 Pruritus, unspecified: Secondary | ICD-10-CM | POA: Insufficient documentation

## 2017-05-16 ENCOUNTER — Other Ambulatory Visit: Payer: Self-pay | Admitting: Obstetrics and Gynecology

## 2017-05-16 DIAGNOSIS — Z1231 Encounter for screening mammogram for malignant neoplasm of breast: Secondary | ICD-10-CM

## 2017-06-13 ENCOUNTER — Ambulatory Visit
Admission: RE | Admit: 2017-06-13 | Discharge: 2017-06-13 | Disposition: A | Discharge status: Home / Self Care | Payer: BLUE CROSS/BLUE SHIELD | Source: Ambulatory Visit | Attending: Obstetrics and Gynecology | Admitting: Obstetrics and Gynecology

## 2017-06-13 DIAGNOSIS — Z1231 Encounter for screening mammogram for malignant neoplasm of breast: Secondary | ICD-10-CM

## 2017-08-22 ENCOUNTER — Ambulatory Visit: Payer: Self-pay | Admitting: Podiatry

## 2017-08-24 ENCOUNTER — Ambulatory Visit (INDEPENDENT_AMBULATORY_CARE_PROVIDER_SITE_OTHER): Payer: BLUE CROSS/BLUE SHIELD

## 2017-08-24 ENCOUNTER — Other Ambulatory Visit: Payer: Self-pay

## 2017-08-24 ENCOUNTER — Ambulatory Visit: Payer: BLUE CROSS/BLUE SHIELD | Admitting: Podiatry

## 2017-08-24 DIAGNOSIS — M779 Enthesopathy, unspecified: Secondary | ICD-10-CM

## 2017-08-24 DIAGNOSIS — M7752 Other enthesopathy of left foot: Secondary | ICD-10-CM

## 2017-08-24 DIAGNOSIS — M21619 Bunion of unspecified foot: Secondary | ICD-10-CM | POA: Diagnosis not present

## 2017-08-28 NOTE — Progress Notes (Signed)
   Subjective: 52 year old female presenting today as a new patient with a chief complaint of bunions bilaterally. He denies any pain but states they are causing the toes to bend up. He is looking for spacers or braces to help. He is not interested in surgical intervention at this time. He has not done anything for treatment. There are no modifying factors noted. Patient is here for further evaluation and treatment.   Past Medical History:  Diagnosis Date  . Allergy    allergies     Objective: Physical Exam General: The patient is alert and oriented x3 in no acute distress.  Dermatology: Skin is cool, dry and supple bilateral lower extremities. Negative for open lesions or macerations.  Vascular: Palpable pedal pulses bilaterally. No edema or erythema noted. Capillary refill within normal limits.  Neurological: Epicritic and protective threshold grossly intact bilaterally.   Musculoskeletal Exam: Clinical evidence of bunion deformity noted to the respective foot. There is a moderate pain on palpation range of motion of the first MPJ. Lateral deviation of the hallux noted consistent with hallux abductovalgus. Hammertoe contracture also noted on clinical exam to the 2nd digits of the bilateral feet. Symptomatic pain on palpation and range of motion also noted to the metatarsal phalangeal joints of the respective hammertoe digits.   Pain with palpation to the 2nd MPJ of the left foot.    Assessment: 1. HAV w/ bunion deformity bilateral  2. 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 2nd MPJ capsulitis left     Plan of Care:  1. Patient was evaluated.  2. Prescription for Meloxicam provided to patient.  3. Recommended good shoes.  4. Darco toe splint dispensed.  5. Return to clinic as needed.    Felecia Shelling, DPM [facility]  Dr. Felecia Shelling, DPM    [address]                                        [place], [place] [address]                Office [phone]  Fax [phone]

## 2018-03-14 ENCOUNTER — Encounter: Payer: Self-pay | Admitting: Family Medicine

## 2018-03-14 ENCOUNTER — Ambulatory Visit (INDEPENDENT_AMBULATORY_CARE_PROVIDER_SITE_OTHER): Payer: No Typology Code available for payment source | Admitting: Family Medicine

## 2018-03-14 DIAGNOSIS — L659 Nonscarring hair loss, unspecified: Secondary | ICD-10-CM

## 2018-03-14 NOTE — Progress Notes (Signed)
Medical Nutrition Therapy Dermatologist Isaac LaudAmy McMichael, MD  Assessment:  Primary concerns today: optimal diet for overall health and specific concern of hair loss.   [name] was seen today for her concern of thinning hair.  She [facility] minoxidil [facility] dermat[facility] Dr. Isaac LaudAmy McMichael, but is interested to also explore possible nutritional aspects.    [name] started working with a Psychologist, educationaltrainer in June 2019, and lost about 5 lb in the first few weeks, which she has not regained.  Body composition analysis through [facility] showed ~21% body fat in July, no change in fat mass despite the 5-lb loss.  Her food intake appears to be marginal, however, given her energy expenditure.  She has noticed that exercise often diminishes her appetite, and she has to be very intentional about getting enough.  The last ferritin [facility] in [facility] is from June 2018, and she has not been taking supplemental iron since then.  In addition, although she has intentions of eating red meat a few times a week, [name] estimates that she typically eats red meat only once a week.    Learning Readiness: Ready  Usual eating pattern: 2-3 meals and 3-4 snacks per day. Frequent foods and beverages: water, hazelnuts, almonds, fruit, tuna 2-3 X wk, 10 oz milk with 1.5-2 tbsp honey (post-exercise), fruit yogurt.   Avoided foods: cereal.   Usual physical activity: Still playing tennis 3-4 X wk; strength-training 60-75 3 X wk; runs ~2-3 mi 2-3 X week; hot yoga 3 X wk. Sleep: Estimates she gets 4-6 hrs of sleep per night.    24-hr recall: (Up at 6 AM (usually gets up ~4 AM) B (6:45 AM)-   5 oz yogurt, 1/4 c hazel nuts&almonds, 1 c grape juice - Played tennis 9-10:30 followed by gym workout  -  Snk (11:30)-   10 oz milk with 1.5-2 tbsp honey L (1 PM)-  1 1/2 c beef stew, water Snk (3 PM)-  1/4 c hazel nuts&almond D (6 PM)-  1 chx thigh, 1 1/2 c grn beans, 1 c mashed potatoes, water Snk (7 PM)-  1/4 c hazel nuts&almond Typical day? No.   Breakfast was typical, but on hot yoga days, dinner is either a normal meal early in the afternoon or snack-like meal (e.g., nuts and yogurt) later in the evening.     Nutritional Diagnosis:  NI-1.4 Inadequate energy intake As related to expenditure.  As evidenced by usual intake of <2000 kcal per diet history.  Handouts given during visit include:  After-Visit Summary (AVS)  Fact sheet on iron, copper, B12, and folate (recommended amounts and food sources)  Demonstrated degree of understanding via:  Teach Back   Monitoring/Evaluation:  Dietary intake, exercise, and body weight prn.

## 2018-03-14 NOTE — Patient Instructions (Addendum)
Nutrition-related concerns Specific nutrients - Re-check ferritin levels.  Re-start 325 mg ferrous sulfate if necessary, along with at least 200 g vitamin C on an empty stomach.  You may want to aim for a ferritin level of at least 100 ng/mL.   - Refer to handout provided today, adapted from info NIH Nutrient Fact Sheets re. copper, iron, B12, and folate.     See for example, https://ods.http://www.mcclain.org/od.nih.gov/factsheets/Copper-HealthProfessional/   Adequate energy intake: - Insufficient intake can be associated with hair loss.  Pay attention to your whole intake, especially carb's.   - Each meal should include a source of protein, carb, and fruit and/or veg's.   - Breakfast should be at least 500 kcal each day.   - Total kcal intake for you at this exercise level should probably be at least 2400 per day.   - Continue to get that milk&honey post-exercise snack.  You may want to get 12-16 oz of milk.   - For long and high-intensity exercise bouts, consider lemonade in lieu of or with coconut water/pedialyte.  - Option: 1 oz Pro-stat (2 tbsp) in 10 oz [employer] 365 brand lemonade, diluted to 20-22 oz with water.  This provides 15 g protein with ~37-8 g carb. - Search for REDs ([employer] Deficiency of Sport).    - A suggestion about sleep:  Start tracking your # of hours of sleep per night.  Aim for 8 hours!  (We manage best what we monitor.)  Consider setting some goals around bedtime or # of hours sleep per night.    - Bread suggestion: [place] [address] ([address], on [address].  Park in the back, off of [address]).  [employer][They also make the world's best pumpkin-choc chip muffins, which are BOGO on Fridays.]  - Multi-vitamin-mineral:  Probably not necessary if you are consistently getting those balanced (and adequate) meals, I.e., protein, carb, and vegs/fruit.    - Call or email if any Qs: [email or web address] / [phone].

## 2018-03-17 ENCOUNTER — Other Ambulatory Visit: Payer: Self-pay | Admitting: *Deleted

## 2018-03-17 DIAGNOSIS — L658 Other specified nonscarring hair loss: Secondary | ICD-10-CM

## 2018-04-10 NOTE — Addendum Note (Signed)
Addended by: Shirlean KellyFARRAR, CLARISSA J on: 04/10/2018 09:10 AM   Modules accepted: Orders

## 2018-04-18 LAB — HOMOCYSTEINE: HOMOCYSTEINE: 7.8 umol/L (ref 0.0–15.0)

## 2018-04-18 LAB — B12 AND FOLATE PANEL
Folate: 17.8 ng/mL (ref >3.0)
Vitamin B-12: 973 pg/mL (ref 232–1245)

## 2018-04-18 LAB — FERRITIN: FERRITIN: 22 ng/mL (ref 15–150)

## 2018-04-18 LAB — METHYLMALONIC ACID, SERUM: Methylmalonic Acid: 67 nmol/L (ref 0–378)

## 2018-04-18 LAB — CERULOPLASMIN: Ceruloplasmin: 19.8 mg/dL (ref 19.0–39.0)

## 2018-09-12 ENCOUNTER — Other Ambulatory Visit: Payer: Self-pay

## 2018-09-12 ENCOUNTER — Encounter: Payer: Self-pay | Admitting: Allergy and Immunology

## 2018-09-12 ENCOUNTER — Ambulatory Visit: Payer: BLUE CROSS/BLUE SHIELD | Admitting: Allergy and Immunology

## 2018-09-12 ENCOUNTER — Telehealth: Payer: Self-pay | Admitting: *Deleted

## 2018-09-12 VITALS — BP 92/62 | HR 48 | Temp 97.8°F | Resp 16 | Ht 66.5 in | Wt 130.0 lb

## 2018-09-12 DIAGNOSIS — R61 Generalized hyperhidrosis: Secondary | ICD-10-CM

## 2018-09-12 DIAGNOSIS — L299 Pruritus, unspecified: Secondary | ICD-10-CM

## 2018-09-12 DIAGNOSIS — L500 Allergic urticaria: Secondary | ICD-10-CM | POA: Diagnosis not present

## 2018-09-12 NOTE — Progress Notes (Signed)
[place] [facility] [place] - [place] - [place] -    NEW PATIENT NOTE  Referring Provider: No ref. provider found Primary Provider: Patient, No Pcp Per Date of office visit: 09/12/2018    Subjective:   Chief Complaint:  [name] (DOB: Sep 12, 1965) is a 53 y.o. female who presents to the clinic on 09/12/2018 with a chief complaint of Pruritus and Urticaria .  HPI: [name] presents to this clinic in evaluation of itchiness.  [name] has noticed progressive itchiness involving multiple areas of her body especially her scalp and neck and extremities and back and abdomen.  She does scratch her skin on occasion.  She will occasionally take Zyrtec which helps with this itchiness but gives her some degree of sedation.  Sometimes she notices a "small whelps" at areas which she itches.  She has not had any associated systemic or constitutional symptoms with this issue other than the fact that she does get night sweats usually during the week of her menstrual cycle.  She does not have a history of atopic disease or atopic symptomatology.  Maybe she has some intermittent fatigue and she did see a "menopause specialist" and apparently everything is okay although the specialist wanted to perform test looking at levels of testosterone.  She has had an issue with low iron levels without an anemia and she has had an issue with bradycardia believed secondary to her physically fit cardiovascular condition.  She did start a rather aggressive exercise program in February 2019 within a few months of developing her itchiness.  She was taking 4 showers per day at that point in time and currently is using 2 showers per day followed by Cetaphil application.  She was using Tide detergent at that point in time and she has since changed her brand to a milder brand and she uses very little detergent.  She was using some sunscreen as well but this does not appear to have been applied to areas of itchiness such as  her abdomen or back.  She was using supplements for iron deficiency and for training which she discontinued 2 months ago.  Sounds as though she was using a multivitamin.  She just started a hair supplement 1 month ago as she apparently has some form of hair loss.  Was using Propecia but discontinued this agent.    She has had Pap smears, mammograms, but has not had a colonoscopy.  Past Medical History:  Diagnosis Date  . Allergy    allergies  . Urticaria     Past Surgical History:  Procedure Laterality Date  . CESAREAN SECTION  06/1999  . CESAREAN SECTION  10/2004    Allergies as of 09/12/2018   No Known Allergies     Medication List       calcium-vitamin D 500-200 MG-UNIT tablet Commonly known as:  OSCAL WITH D Take 1-2 tablets by mouth once a week.   cetirizine 10 MG chewable tablet Commonly known as:  ZYRTEC Chew 10 mg by mouth daily.   DHA 200 MG Caps Take 1 tablet by mouth once a week.   ferrous sulfate 325 (65 FE) MG tablet Take 650 mg by mouth daily with breakfast.   Lutein 20 MG Caps Take 1-2 capsules by mouth once a week.   multivitamin with minerals tablet Take 1 tablet by mouth daily.   Super Omega-3 1000 MG Caps Take 1-2 capsules by mouth once a week.   EPA PO Take 300 mg by mouth once a  week.   vitamin C 250 MG tablet Commonly known as:  ASCORBIC ACID Take 250 mg by mouth daily.       Review of systems negative except as noted in HPI / PMHx or noted below:  Review of Systems  Constitutional: Negative.   HENT: Negative.   Eyes: Negative.   Respiratory: Negative.   Cardiovascular: Negative.   Gastrointestinal: Negative.   Genitourinary: Negative.   Musculoskeletal: Negative.   Skin: Negative.   Neurological: Negative.   Endo/Heme/Allergies: Negative.   Psychiatric/Behavioral: Negative.     Family History  Adopted: Yes    Social History   Socioeconomic History  . Marital status: Married    Spouse name: Not on file  . Number  of children: Not on file  . Years of education: Not on file  . Highest education level: Not on file  Occupational History  . Not on file  Social Needs  . Financial resource strain: Not on file  . Food insecurity:    Worry: Not on file    Inability: Not on file  . Transportation needs:    Medical: Not on file    Non-medical: Not on file  Tobacco Use  . Smoking status: Never Smoker  . Smokeless tobacco: Never Used  Substance and Sexual Activity  . Alcohol use: Not Currently  . Drug use: Never  . Sexual activity: Not on file  Lifestyle  . Physical activity:    Days per week: Not on file    Minutes per session: Not on file  . Stress: Not on file  Relationships  . Social connections:    Talks on phone: Not on file    Gets together: Not on file    Attends religious service: Not on file    Active member of club or organization: Not on file    Attends meetings of clubs or organizations: Not on file    Relationship status: Not on file  . Intimate partner violence:    Fear of current or ex partner: Not on file    Emotionally abused: Not on file    Physically abused: Not on file    Forced sexual activity: Not on file  Other Topics Concern  . Not on file  Social History Narrative   Occupation : housewife   Married   Children 1 boy  1 girl    Caffeine tea/ rare   Never smoked   No alcohol    Environmental and Social history  Lives in a house with a dry environment, a dog located inside the household, hardwood in the bedroom, no plastic on the bed, no plastic on the pillow, no smoking ongoing with inside the household.  Objective:   Vitals:   09/12/18 0859  BP: 92/62  Pulse: (!) 48  Resp: 16  Temp: 97.8 F (36.6 C)  SpO2: 98%   Height: 5' 6.5" (168.9 cm) Weight: 130 lb (59 kg)  Physical Exam Constitutional:      Appearance: She is not diaphoretic.  HENT:     Head: Normocephalic. No right periorbital erythema or left periorbital erythema.     Right Ear:  Tympanic membrane, ear canal and external ear normal.     Left Ear: Tympanic membrane, ear canal and external ear normal.     Nose: Nose normal. No mucosal edema or rhinorrhea.     Mouth/Throat:     Pharynx: No oropharyngeal exudate.  Eyes:     General: Lids are normal.  Conjunctiva/sclera: Conjunctivae normal.     Pupils: Pupils are equal, round, and reactive to light.  Neck:     Thyroid: No thyromegaly.     Trachea: Trachea normal. No tracheal deviation.  Cardiovascular:     Rate and Rhythm: Normal rate and regular rhythm.     Heart sounds: S1 normal and S2 normal. Murmur (Slight systolic murmur) present.  Pulmonary:     Effort: Pulmonary effort is normal. No respiratory distress.     Breath sounds: No stridor. No wheezing or rales.  Chest:     Chest wall: No tenderness.  Abdominal:     General: There is no distension.     Palpations: Abdomen is soft. There is no mass.     Tenderness: There is no abdominal tenderness. There is no guarding or rebound.  Musculoskeletal:        General: No tenderness.  Lymphadenopathy:     Head:     Right side of head: No tonsillar adenopathy.     Left side of head: No tonsillar adenopathy.     Cervical: No cervical adenopathy.  Skin:    Coloration: Skin is not pale.     Findings: No erythema or rash.     Nails: There is no clubbing.   Neurological:     Mental Status: She is alert.     Diagnostics: Allergy skin tests were performed.  She demonstrated hypersensitivity to house dust mite and weed pollen.  She did not demonstrate any hypersensitivity to a screening panel of foods.  Assessment and Plan:    1. Pruritic disorder   2. Allergic urticaria   3. Night sweats     1.  Allergen avoidance measures?  2.  Can use loratadine 10 mg - 1-2 tablets daily if needed  3.  Apply Vaseline after shower while still wet  4.  Blood -CBC with differential, CMP, TSH, T4, T3, TP, antimitochondrial antibody, celiac screen, QuantiFERON,  testosterone  5.  Obtain a colonoscopy  6.  Further evaluation treatment?  Yes, if progressive itchiness or hives  [name] has a pruritic disorder with possible urticaria and also appears to have intermittent night sweats for which we will have her obtain the blood tests noted above in investigation of [place] worrisome systemic disease contributing to these issues.  There very well could be an atopic component to this skin disorder and we will get her to perform allergen avoidance measures directed against house dust mite and I am going to ask her to use Vaseline immediately after her shower to help with hydration of her skin.  I would like for her to obtain a colonoscopy as she is 53 years old.  I will see her back in this clinic pending her response to therapy noted above and the results of her diagnostic testing.  Sara Schimke, MD Allergy / Immunology [facility]

## 2018-09-12 NOTE — Patient Instructions (Addendum)
  1.  Allergen avoidance measures?  2.  Can use loratadine 10 mg - 1-2 tablets daily if needed  3.  Apply Vaseline after shower while still wet  4.  Blood -CBC with differential, CMP, TSH, T4, T3, TP, antimitochondrial antibody, celiac screen, QuantiFERON, testosterone  5.  Obtain a colonoscopy  6.  Further evaluation treatment?  Yes, if progressive itchiness or hives

## 2018-09-12 NOTE — Telephone Encounter (Signed)
Patient needs a referral to Dr. Adela Lank [facility] for a colonoscopy. Thank You!

## 2018-09-13 ENCOUNTER — Encounter: Payer: Self-pay | Admitting: Allergy and Immunology

## 2018-09-13 LAB — TESTOSTERONE: Testosterone: 8 ng/dL (ref 3–41)

## 2018-09-13 LAB — T3: T3, Total: 115 ng/dL (ref 71–180)

## 2018-09-14 ENCOUNTER — Telehealth: Payer: Self-pay | Admitting: Allergy and Immunology

## 2018-09-14 LAB — COMPREHENSIVE METABOLIC PANEL
ALT: 26 IU/L (ref 0–32)
AST: 41 IU/L — ABNORMAL HIGH (ref 0–40)
Albumin/Globulin Ratio: 1.8 (ref 1.2–2.2)
Albumin: 4.6 g/dL (ref 3.8–4.8)
Alkaline Phosphatase: 59 IU/L (ref 39–117)
BUN/Creatinine Ratio: 9 (ref 9–23)
BUN: 7 mg/dL (ref 6–24)
Bilirubin Total: 0.5 mg/dL (ref 0.0–1.2)
CO2: 23 mmol/L (ref 20–29)
Calcium: 9.1 mg/dL (ref 8.7–10.2)
Chloride: 100 mmol/L (ref 96–106)
Creatinine, Ser: 0.82 mg/dL (ref 0.57–1.00)
GFR calc Af Amer: 96 mL/min/{1.73_m2} (ref >59)
GFR calc non Af Amer: 84 mL/min/{1.73_m2} (ref >59)
Globulin, Total: 2.6 g/dL (ref 1.5–4.5)
Glucose: 76 mg/dL (ref 65–99)
Potassium: 4.3 mmol/L (ref 3.5–5.2)
Sodium: 138 mmol/L (ref 134–144)
Total Protein: 7.2 g/dL (ref 6.0–8.5)

## 2018-09-14 LAB — CBC WITH DIFFERENTIAL
Basophils Absolute: 0.1 10*3/uL (ref 0.0–0.2)
Basos: 1 %
EOS (ABSOLUTE): 0.3 10*3/uL (ref 0.0–0.4)
Eos: 4 %
Hematocrit: 39.1 % (ref 34.0–46.6)
Hemoglobin: 13 g/dL (ref 11.1–15.9)
Immature Grans (Abs): 0 10*3/uL (ref 0.0–0.1)
Immature Granulocytes: 0 %
Lymphocytes Absolute: 1.4 10*3/uL (ref 0.7–3.1)
Lymphs: 19 %
MCH: 28.4 pg (ref 26.6–33.0)
MCHC: 33.2 g/dL (ref 31.5–35.7)
MCV: 85 fL (ref 79–97)
Monocytes Absolute: 0.4 10*3/uL (ref 0.1–0.9)
Monocytes: 5 %
Neutrophils Absolute: 4.9 10*3/uL (ref 1.4–7.0)
Neutrophils: 71 %
RBC: 4.58 x10E6/uL (ref 3.77–5.28)
RDW: 12.5 % (ref 11.7–15.4)
WBC: 7 10*3/uL (ref 3.4–10.8)

## 2018-09-14 LAB — T4 AND TSH
T4, Total: 7 ug/dL (ref 4.5–12.0)
TSH: 2.06 u[IU]/mL (ref 0.450–4.500)

## 2018-09-14 LAB — QUANTIFERON-TB GOLD PLUS

## 2018-09-14 LAB — CELIAC PANEL 10
Antigliadin Abs, IgA: 4 units (ref 0–19)
Endomysial IgA: NEGATIVE
Gliadin IgG: 2 units (ref 0–19)
IgA/Immunoglobulin A, Serum: 280 mg/dL (ref 87–352)
Tissue Transglut Ab: 4 U/mL (ref 0–5)
Transglutaminase IgA: <2 U/mL (ref 0–3)

## 2018-09-14 LAB — THYROID PEROXIDASE ANTIBODY: Thyroperoxidase Ab SerPl-aCnc: 20 IU/mL (ref 0–34)

## 2018-09-14 LAB — MITOCHONDRIAL ANTIBODIES: Mitochondrial Ab: <20 Units (ref 0.0–20.0)

## 2018-09-14 NOTE — Telephone Encounter (Signed)
PT called to have the referral sent to Dr Kerin Salen [facility] - ph# [phone]

## 2018-09-14 NOTE — Telephone Encounter (Signed)
Referral has been sent to their office.

## 2018-09-14 NOTE — Telephone Encounter (Signed)
Referral has been placed to their office.  

## 2018-09-22 ENCOUNTER — Telehealth: Payer: Self-pay | Admitting: *Deleted

## 2018-09-22 NOTE — Telephone Encounter (Signed)
Mailed letter to patient's home to call office. 

## 2018-09-22 NOTE — Telephone Encounter (Signed)
-----   Message from Jessica Priest, MD sent at 09/18/2018  7:59 AM EDT ----- Please inform patient that blood test came back normal, however the QuantiFERON test was not performed and needs to be repeated in investigation of her night sweats.  She did have a slight elevation in her liver function test which was right on the border of normal.  She needs to make sure that at some point in the past someone has screened her for hepatitis C antibody which should have been done at some point in the past 5.  If she cannot find that test then we will have her obtain that hepatitis C antibody screen.  If she would find it more convenient we could do both the QuantiFERON test and the hepatitis  C antibody screen during her upcoming blood draw.

## 2018-09-26 ENCOUNTER — Telehealth: Payer: Self-pay | Admitting: Allergy and Immunology

## 2018-09-26 NOTE — Telephone Encounter (Signed)
PT called for test results. Please call her back at [phone].

## 2018-09-27 NOTE — Telephone Encounter (Signed)
Called patient but received no answer.  

## 2018-09-29 ENCOUNTER — Telehealth: Payer: Self-pay | Admitting: *Deleted

## 2018-09-29 DIAGNOSIS — L299 Pruritus, unspecified: Secondary | ICD-10-CM

## 2018-09-29 DIAGNOSIS — R61 Generalized hyperhidrosis: Secondary | ICD-10-CM

## 2018-09-29 NOTE — Telephone Encounter (Signed)
Called patient again but I still did not get an answer.

## 2018-09-29 NOTE — Telephone Encounter (Signed)
Patient called back and lab results were sent in for Quantiferon and Hep C Antibody to [facility]. Patient is going to get labs drawn now.

## 2018-09-29 NOTE — Telephone Encounter (Signed)
Spoke with patient as well this morning and went over lab results. Patient stated she would go and get labs drawn today. Lab orders were placed and sent electronically and I also faxed a copy to the [facility] location patient was going to. Patient informed.

## 2018-09-29 NOTE — Telephone Encounter (Signed)
Patient called back to have needed labs sent to [facility]. Quantefiron and Hep C Antibody have been sent to [facility] and patient is getting Labs drawn.

## 2018-10-02 LAB — QUANTIFERON-TB GOLD PLUS
QuantiFERON Mitogen Value: 4.26 IU/mL
QuantiFERON Nil Value: 0.02 IU/mL
QuantiFERON TB1 Ag Value: 0.05 IU/mL
QuantiFERON TB2 Ag Value: 0.02 IU/mL
QuantiFERON-TB Gold Plus: NEGATIVE

## 2018-10-02 LAB — HEPATITIS C ANTIBODY: Hep C Virus Ab: <0.1 s/co ratio (ref 0.0–0.9)

## 2018-10-12 NOTE — Telephone Encounter (Signed)
Patient had a tele visit with Dr Therisa Doyne on 10/10/18. She has also been set up for a colonoscopy on 7/20 @ 10:15. I have placed the notes from the GI Doc in Dr Lyndon Code box.

## 2019-07-11 ENCOUNTER — Other Ambulatory Visit: Payer: Self-pay | Admitting: Obstetrics and Gynecology

## 2019-07-11 DIAGNOSIS — Z1231 Encounter for screening mammogram for malignant neoplasm of breast: Secondary | ICD-10-CM

## 2019-07-13 ENCOUNTER — Other Ambulatory Visit: Payer: Self-pay

## 2019-07-13 ENCOUNTER — Ambulatory Visit
Admission: RE | Admit: 2019-07-13 | Discharge: 2019-07-13 | Disposition: A | Discharge status: Home / Self Care | Payer: PRIVATE HEALTH INSURANCE | Source: Ambulatory Visit | Attending: Obstetrics and Gynecology | Admitting: Obstetrics and Gynecology

## 2019-07-13 DIAGNOSIS — Z1231 Encounter for screening mammogram for malignant neoplasm of breast: Secondary | ICD-10-CM

## 2019-09-21 ENCOUNTER — Other Ambulatory Visit: Payer: Self-pay | Admitting: Obstetrics and Gynecology

## 2019-09-21 DIAGNOSIS — N6320 Unspecified lump in the left breast, unspecified quadrant: Secondary | ICD-10-CM

## 2019-10-02 ENCOUNTER — Other Ambulatory Visit: Payer: No Typology Code available for payment source

## 2019-10-09 ENCOUNTER — Other Ambulatory Visit: Payer: Self-pay

## 2019-10-09 ENCOUNTER — Ambulatory Visit
Admission: RE | Admit: 2019-10-09 | Discharge: 2019-10-09 | Disposition: A | Discharge status: Home / Self Care | Payer: No Typology Code available for payment source | Source: Ambulatory Visit | Attending: Obstetrics and Gynecology | Admitting: Obstetrics and Gynecology

## 2019-10-09 DIAGNOSIS — N6320 Unspecified lump in the left breast, unspecified quadrant: Secondary | ICD-10-CM

## 2020-07-15 ENCOUNTER — Other Ambulatory Visit: Payer: Self-pay | Admitting: Obstetrics and Gynecology

## 2020-07-15 DIAGNOSIS — Z1231 Encounter for screening mammogram for malignant neoplasm of breast: Secondary | ICD-10-CM

## 2020-09-16 ENCOUNTER — Ambulatory Visit: Payer: No Typology Code available for payment source

## 2020-10-22 ENCOUNTER — Other Ambulatory Visit: Payer: Self-pay | Admitting: Orthopedic Surgery

## 2020-10-22 ENCOUNTER — Other Ambulatory Visit: Payer: Self-pay | Admitting: Obstetrics and Gynecology

## 2020-10-22 DIAGNOSIS — M545 Low back pain, unspecified: Secondary | ICD-10-CM

## 2020-11-17 ENCOUNTER — Other Ambulatory Visit: Payer: Self-pay

## 2020-11-17 ENCOUNTER — Ambulatory Visit
Admission: RE | Admit: 2020-11-17 | Discharge: 2020-11-17 | Disposition: A | Discharge status: Home / Self Care | Payer: No Typology Code available for payment source | Source: Ambulatory Visit | Attending: Obstetrics and Gynecology | Admitting: Obstetrics and Gynecology

## 2020-11-17 DIAGNOSIS — Z1231 Encounter for screening mammogram for malignant neoplasm of breast: Secondary | ICD-10-CM

## 2021-03-08 IMAGING — US US BREAST*L* LIMITED INC AXILLA
1 series · 6 of 6 positions shown · non-contrast
Comparison: Previous exam(s).

CLINICAL DATA: 53-year-old presenting with a possible palpable lump
in the UPPER INNER LEFT breast which she noticed initially
approximately 2 weeks ago. She states that the lump has decreased in
size in the interval.

EXAM:
DIGITAL DIAGNOSTIC LEFT MAMMOGRAM WITH CAD AND TOMO
ULTRASOUND LEFT BREAST

[Series 1: us breast*left* limited inc axilla · 0.04mm/px · 6 of 6 slices shown]
[im 1/6]
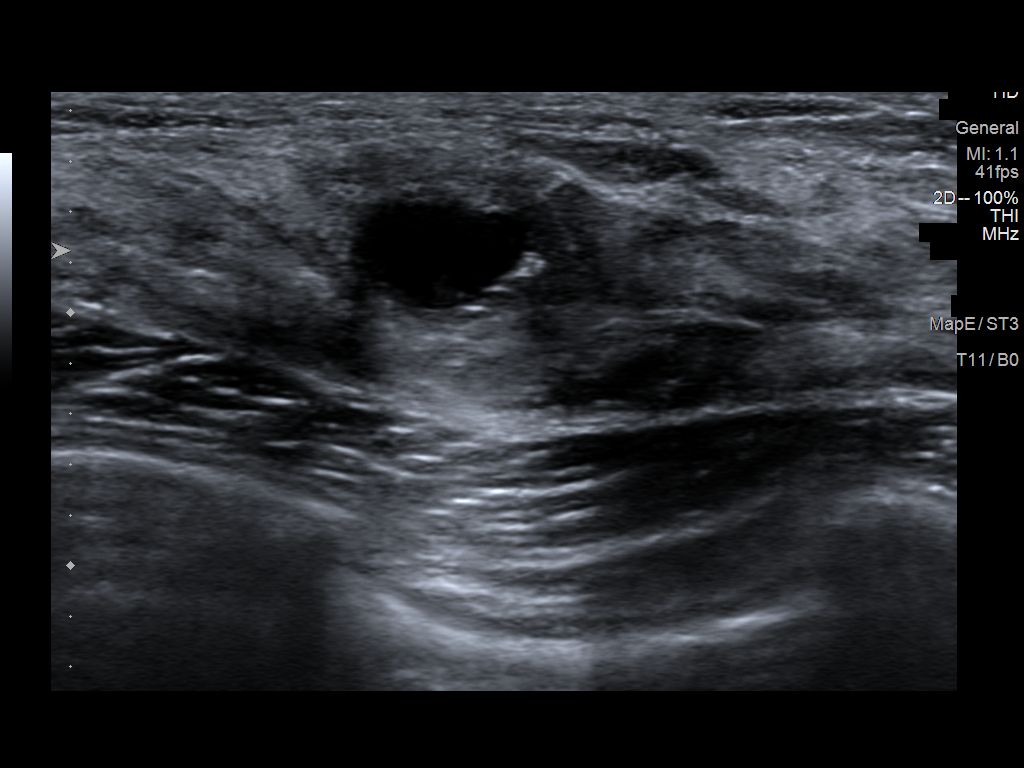
[im 2/6]
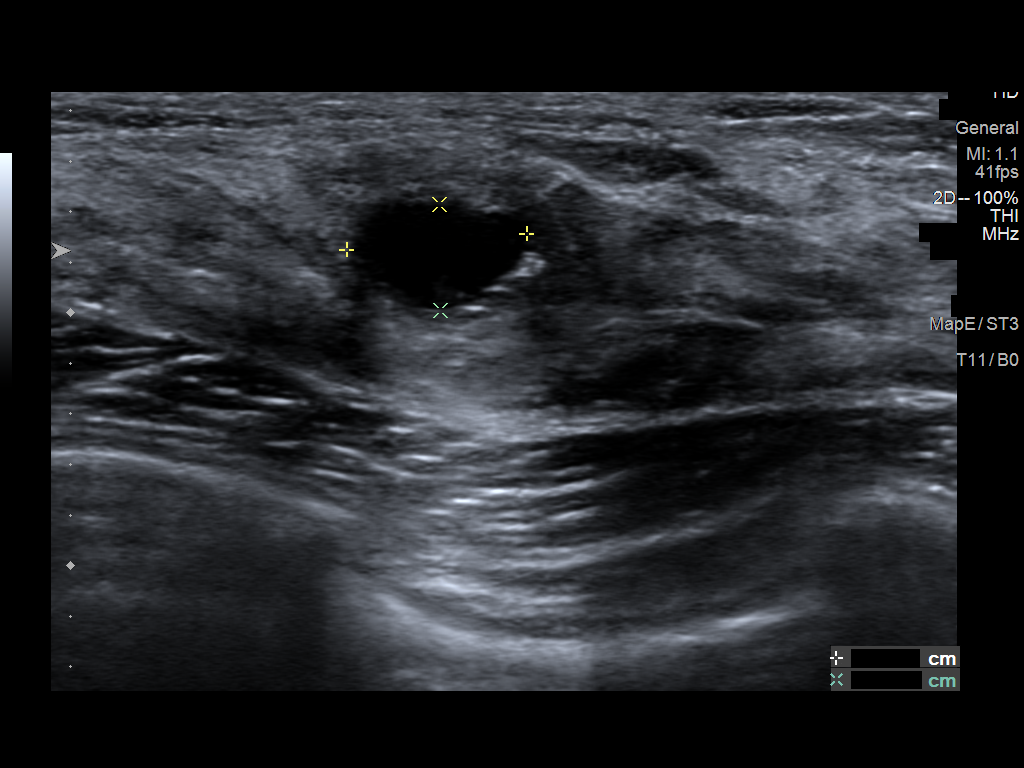
[im 3/6]
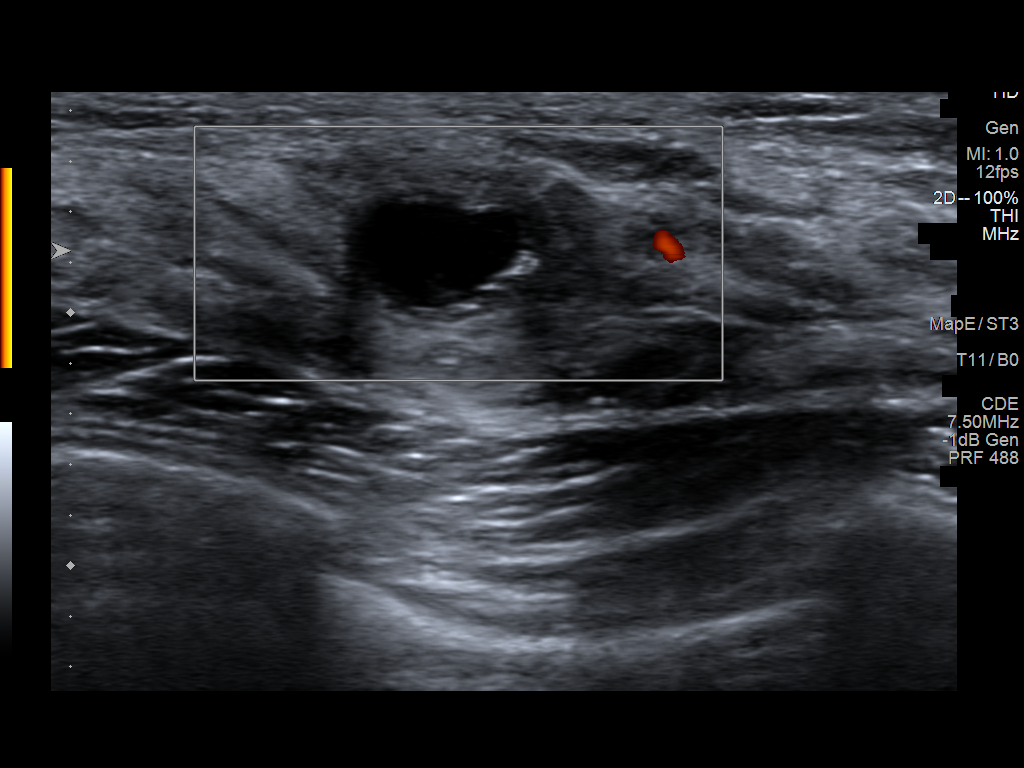
[im 4/6]
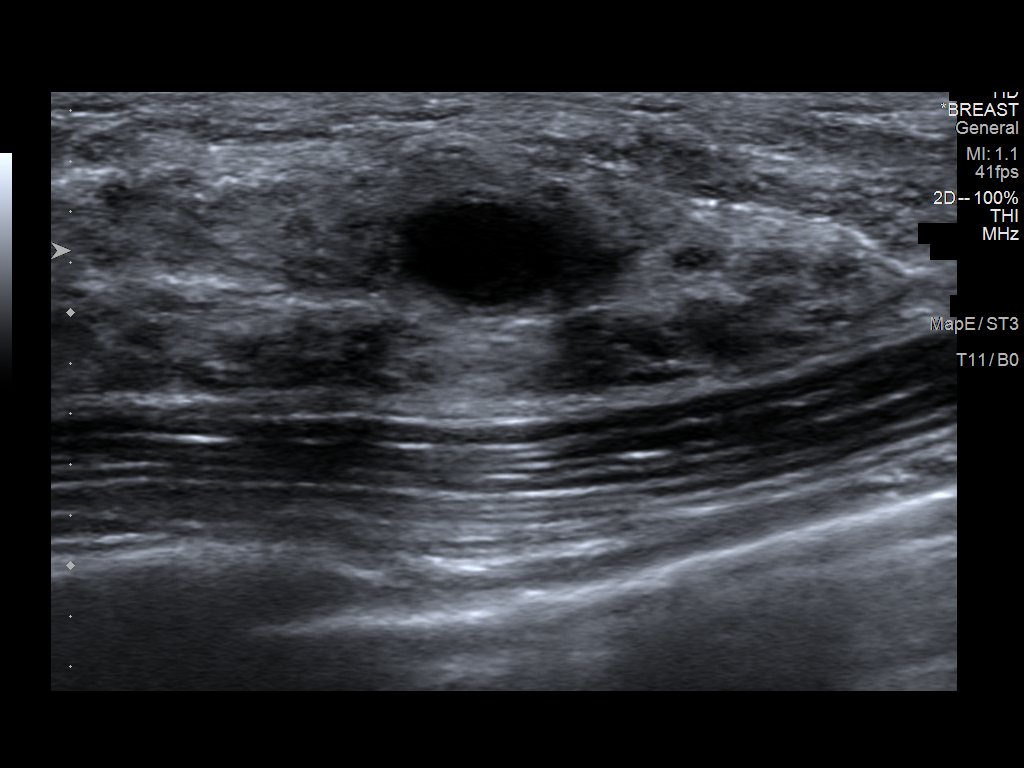
[im 5/6]
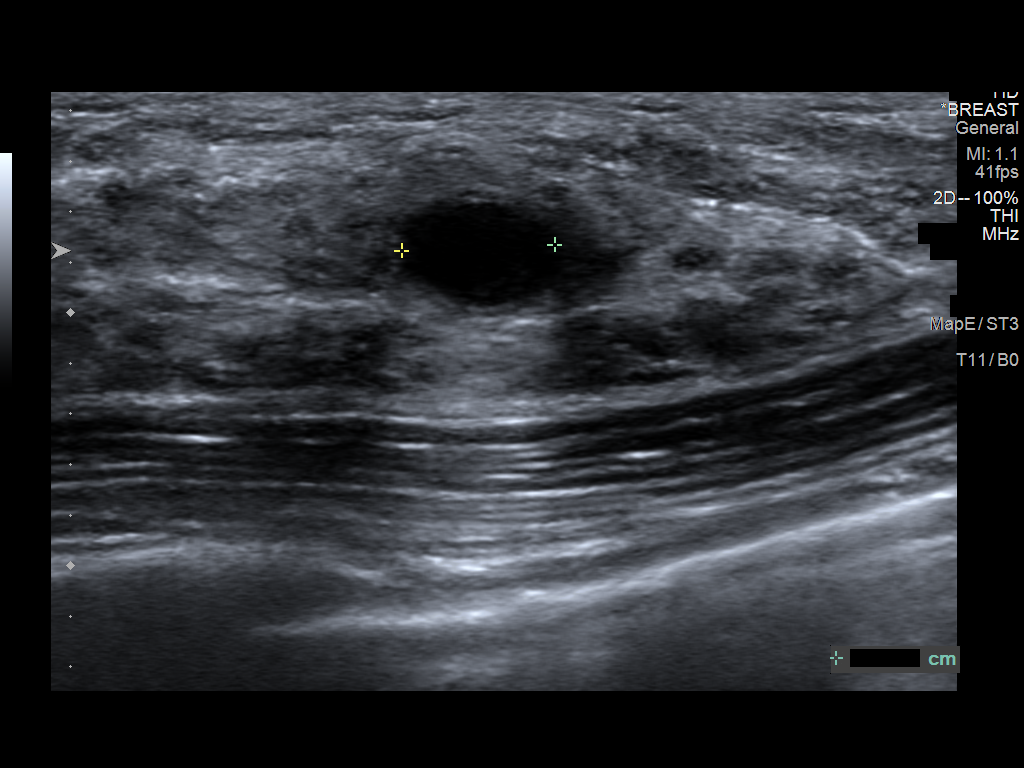
[im 6/6]
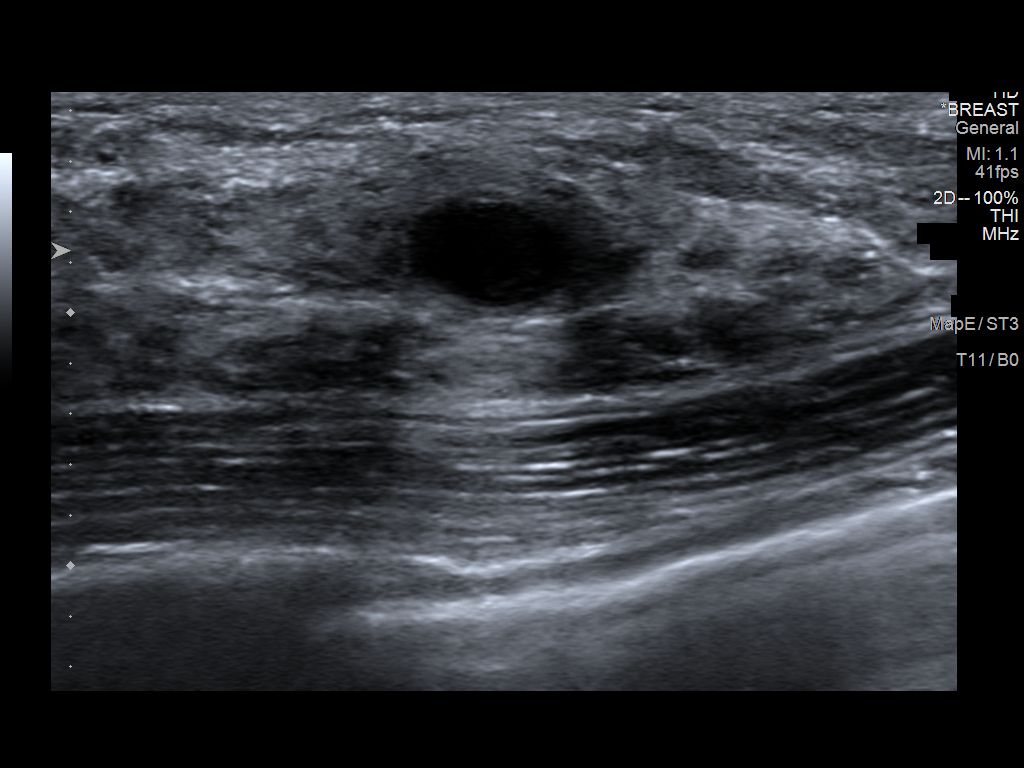

[6 of 6 positions shown; findings below may reference images not displayed]

ACR Breast Density Category d: The breast tissue is extremely dense,
which lowers the sensitivity of mammography.
FINDINGS: Tomosynthesis and synthesized full field CC and MLO views of the
LEFT breast were obtained. Tomosynthesis and synthesized spot
compression tangential view of the area of concern in the LEFT
breast was also obtained.

No mammographic abnormality in the area of palpable concern in the
UPPER INNER breast. Normal extremely dense fibroglandular tissue is
present in this location.

No findings suspicious for malignancy in the LEFT breast.

Mammographic images were processed with CAD.

On correlative physical exam, there is a freely mobile palpable
approximate 1 cm lump in the UPPER INNER QUADRANT of the LEFT breast
corresponding to what the patient is feeling.

Targeted LEFT breast ultrasound is performed, showing a benign
simple cyst at the 10 o'clock position approximately 2 cm from the
nipple measuring approximately 7 x 4 x 6 mm, demonstrating posterior
acoustic enhancement and no internal power Doppler flow,
corresponding to the palpable concern. The cyst was obscured by the
patient's dense breast tissue on mammography.
IMPRESSION: Benign 7 mm simple cyst involving the UPPER INNER QUADRANT of the
LEFT breast which accounts for the palpable concern.

RECOMMENDATION:
Annual BILATERAL screening mammography which is due in July 2020.

I have discussed the findings and recommendations with the patient.
If applicable, a reminder letter will be sent to the patient
regarding the next appointment.

BI-RADS CATEGORY  2: Benign.

## 2021-05-28 ENCOUNTER — Other Ambulatory Visit: Payer: No Typology Code available for payment source

## 2021-09-29 ENCOUNTER — Telehealth: Payer: Self-pay | Admitting: Hematology and Oncology

## 2021-09-29 NOTE — Telephone Encounter (Signed)
Scheduled appt per 5/9 referral. Pt is aware of appt date and time. Pt is aware to arrive 15 mins prior to appt time and to bring and updated insurance card. Pt is aware of appt location.   ?

## 2021-10-14 ENCOUNTER — Telehealth: Payer: Self-pay | Admitting: Hematology and Oncology

## 2021-10-14 NOTE — Telephone Encounter (Signed)
Per 6/14 reminder call called and left message about new pt appointment callback number left

## 2021-10-15 ENCOUNTER — Other Ambulatory Visit: Payer: Self-pay | Admitting: Lab

## 2021-10-15 ENCOUNTER — Inpatient Hospital Stay: Payer: No Typology Code available for payment source

## 2021-10-15 ENCOUNTER — Inpatient Hospital Stay
Payer: No Typology Code available for payment source | Attending: Hematology and Oncology | Admitting: Hematology and Oncology

## 2021-10-15 ENCOUNTER — Other Ambulatory Visit: Payer: Self-pay

## 2021-10-15 VITALS — BP 99/63 | HR 71 | Temp 98.0°F | Resp 16 | Ht 66.5 in | Wt 126.3 lb

## 2021-10-15 DIAGNOSIS — D500 Iron deficiency anemia secondary to blood loss (chronic): Secondary | ICD-10-CM | POA: Diagnosis not present

## 2021-10-15 DIAGNOSIS — D509 Iron deficiency anemia, unspecified: Secondary | ICD-10-CM | POA: Insufficient documentation

## 2021-10-15 LAB — RETIC PANEL
Immature Retic Fract: 7.6 % (ref 2.3–15.9)
RBC.: 4.52 MIL/uL (ref 3.87–5.11)
Retic Count, Absolute: 44.3 10*3/uL (ref 19.0–186.0)
Retic Ct Pct: 1 % (ref 0.4–3.1)
Reticulocyte Hemoglobin: 31.8 pg (ref >27.9)

## 2021-10-15 LAB — CMP (CANCER CENTER ONLY)
ALT: 19 U/L (ref 0–44)
AST: 32 U/L (ref 15–41)
Albumin: 4.4 g/dL (ref 3.5–5.0)
Alkaline Phosphatase: 66 U/L (ref 38–126)
Anion gap: 5 (ref 5–15)
BUN: 17 mg/dL (ref 6–20)
CO2: 31 mmol/L (ref 22–32)
Calcium: 9.4 mg/dL (ref 8.9–10.3)
Chloride: 103 mmol/L (ref 98–111)
Creatinine: 0.8 mg/dL (ref 0.44–1.00)
GFR, Estimated: >60 mL/min (ref >60)
Glucose, Bld: 83 mg/dL (ref 70–99)
Potassium: 4.1 mmol/L (ref 3.5–5.1)
Sodium: 139 mmol/L (ref 135–145)
Total Bilirubin: 0.5 mg/dL (ref 0.3–1.2)
Total Protein: 7.6 g/dL (ref 6.5–8.1)

## 2021-10-15 LAB — CBC WITH DIFFERENTIAL (CANCER CENTER ONLY)
Abs Immature Granulocytes: 0.01 10*3/uL (ref 0.00–0.07)
Basophils Absolute: 0.1 10*3/uL (ref 0.0–0.1)
Basophils Relative: 1 %
Eosinophils Absolute: 0.3 10*3/uL (ref 0.0–0.5)
Eosinophils Relative: 4 %
HCT: 38.7 % (ref 36.0–46.0)
Hemoglobin: 12.7 g/dL (ref 12.0–15.0)
Immature Granulocytes: 0 %
Lymphocytes Relative: 17 %
Lymphs Abs: 1.4 10*3/uL (ref 0.7–4.0)
MCH: 27.5 pg (ref 26.0–34.0)
MCHC: 32.8 g/dL (ref 30.0–36.0)
MCV: 83.9 fL (ref 80.0–100.0)
Monocytes Absolute: 0.4 10*3/uL (ref 0.1–1.0)
Monocytes Relative: 5 %
Neutro Abs: 6.1 10*3/uL (ref 1.7–7.7)
Neutrophils Relative %: 73 %
Platelet Count: 242 10*3/uL (ref 150–400)
RBC: 4.61 MIL/uL (ref 3.87–5.11)
RDW: 12.9 % (ref 11.5–15.5)
WBC Count: 8.3 10*3/uL (ref 4.0–10.5)
nRBC: 0 % (ref 0.0–0.2)

## 2021-10-15 LAB — IRON AND IRON BINDING CAPACITY (CC-WL,HP ONLY)
Iron: 81 ug/dL (ref 28–170)
Saturation Ratios: 24 % (ref 10.4–31.8)
TIBC: 336 ug/dL (ref 250–450)
UIBC: 255 ug/dL (ref 148–442)

## 2021-10-15 NOTE — Progress Notes (Signed)
[facility] Telephone:([phone]) [phone]   [phone]  INITIAL CONSULT NOTE  Patient Care Team: Patient, No Pcp Per as PCP - General (General Practice) Linna Darner, RD as Dietitian (Family Medicine)  Hematological/Oncological History # Iron Deficiency without Anemia 09/12/2018: WBC 7.0, Hgb 13.0, MCV 85 09/03/2021: Ferritin 26.6, WBC 7.2, Hgb 12.9,  MCV 83.9, Plt 287 10/15/2021: establish care with Dr. Leonides Schanz   CHIEF COMPLAINTS/PURPOSE OF CONSULTATION:  "ron Deficiency without Anemia "  HISTORY OF PRESENTING ILLNESS:  [name] 56 y.o. female with medical history significant for seasonal allergies who presents for evaluation of iron deficiency anemia.   On review of the previous records [name] last had labs in our system on 09/12/2018 at which time her hemoglobin is 13.0 and MCV 85.  More recently on 09/03/2021 the patient was noted to have a ferritin of 26.6, hemoglobin 12.9, white blood cell count 7.2, MCV 83.9, and platelets of 287.  Due to concern for persistent iron deficiency anemia despite p.o. iron therapy the patient presents for evaluation by hematology.  On exam today [name] reports that a few years ago she began struggling with severe fatigue.  She is a runner and plays tennis and was training for marathon but felt like she was having persistent low energy.  She notes that she felt like she would "blackout".  Shewas seen by sports medicine doctor who found that she had low ferritin levels and put her on iron pills.  She reports was also been having issues with urticaria and hair loss/alopecia.  She notes that since starting the p.o. iron therapy her energy levels have rebounded.  She notes that she does continue to have hair loss and itching however.  She notes that she has been taking p.o. iron therapy up to 3 times per day.  She takes with vitamin C on empty stomach with plenty of water.  On further discussion she notes that she used to have  pretty heavy menstrual cycles but these have slowed down in the last 6 months.  She notes that she has been having some periods which are continually ongoing.  She has been on birth control before in the past but has not been on any recently.  She notes that she does eat 3 full meals a day and tries to eat red meat about once per week.  She did have a colonoscopy at age 47 with [facility] and was told to come back in 10 years time.  Regarding family history the patient is adopted.  She has 2 children ages 83 and 66 both of them are healthy.  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  She reports that she is not currently having any symptoms other than some mild fatigue.  Her energy is good and she is sleeping well.  She denies any fevers, chills, sweats, nausea, vomiting or diarrhea.  A full 10 point ROS is listed below.  MEDICAL HISTORY:  Past Medical History:  Diagnosis Date   Allergy    allergies   Urticaria     SURGICAL HISTORY: Past Surgical History:  Procedure Laterality Date   CESAREAN SECTION  06/1999   CESAREAN SECTION  10/2004    SOCIAL HISTORY: Social History   Socioeconomic History   Marital status: Married    Spouse name: Not on file   Number of children: Not on file   Years of education: Not on file   Highest education level:  Not on file  Occupational History   Not on file  Tobacco Use   Smoking status: Never   Smokeless tobacco: Never  Vaping Use   Vaping Use: Never used  Substance and Sexual Activity   Alcohol use: Not Currently   Drug use: Never   Sexual activity: Not on file  Other Topics Concern   Not on file  Social History Narrative   Occupation : housewife   Married   Children 1 boy  1 girl    Caffeine tea/ rare   Never smoked   No alcohol   Social Determinants of Corporate investment banker Strain: Not on file  Food Insecurity: Not on file  Transportation Needs: Not on file  Physical Activity: Not on file   Stress: Not on file  Social Connections: Not on file  Intimate Partner Violence: Not on file    FAMILY HISTORY: Family History  Adopted: Yes    ALLERGIES:  is allergic to norethin ace-eth estrad-fe.  MEDICATIONS:  Current Outpatient Medications  Medication Sig Dispense Refill   calcium-vitamin D (OSCAL WITH D) 500-200 MG-UNIT tablet Take 1-2 tablets by mouth once a week.     cetirizine (ZYRTEC) 10 MG chewable tablet Chew 10 mg by mouth daily.     ferrous sulfate 325 (65 FE) MG tablet Take 650 mg by mouth daily with breakfast.     finasteride (PROSCAR) 5 MG tablet finasteride     Lutein 20 MG CAPS Take 1-2 capsules by mouth once a week.     Multiple Vitamins-Minerals (MULTIVITAMIN WITH MINERALS) tablet Take 1 tablet by mouth daily.     Omega-3 Fatty Acids (SUPER OMEGA-3) 1000 MG CAPS Take 1-2 capsules by mouth once a week.     vitamin C (ASCORBIC ACID) 250 MG tablet Take 250 mg by mouth daily.     No current facility-administered medications for this visit.    REVIEW OF SYSTEMS:   Constitutional: ( - ) fevers, ( - )  chills , ( - ) night sweats Eyes: ( - ) blurriness of vision, ( - ) double vision, ( - ) watery eyes Ears, nose, mouth, throat, and face: ( - ) mucositis, ( - ) sore throat Respiratory: ( - ) cough, ( - ) dyspnea, ( - ) wheezes Cardiovascular: ( - ) palpitation, ( - ) chest discomfort, ( - ) lower extremity swelling Gastrointestinal:  ( - ) nausea, ( - ) heartburn, ( - ) change in bowel habits Skin: ( - ) abnormal skin rashes Lymphatics: ( - ) new lymphadenopathy, ( - ) easy bruising Neurological: ( - ) numbness, ( - ) tingling, ( - ) new weaknesses Behavioral/Psych: ( - ) mood change, ( - ) new changes  All other systems were reviewed with the patient and are negative.  PHYSICAL EXAMINATION:  Vitals:   10/15/21 1313  BP: 99/63  Pulse: 71  Resp: 16  Temp: 98 F (36.7 C)  SpO2: 98%   Filed Weights   10/15/21 1313  Weight: 126 lb 4.8 oz (57.3 kg)     GENERAL: well appearing middle-aged African-American female in NAD  SKIN: skin color, texture, turgor are normal, no rashes or significant lesions EYES: conjunctiva are pink and non-injected, sclera clear LUNGS: clear to auscultation and percussion with normal breathing effort HEART: regular rate & rhythm and no murmurs and no lower extremity edema Musculoskeletal: no cyanosis of digits and no clubbing  PSYCH: alert & oriented x 3, fluent speech NEURO: no  focal motor/sensory deficits  LABORATORY DATA:  I have reviewed the data as listed    Latest Ref Rng & Units 10/15/2021    2:08 PM 09/12/2018   11:11 AM 03/25/2014    9:01 AM  CBC  WBC 4.0 - 10.5 K/uL 8.3  7.0    Hemoglobin 12.0 - 15.0 g/dL 12.7  13.0  CANCELED   Hematocrit 36.0 - 46.0 % 38.7  39.1    Platelets 150 - 400 K/uL 242          Latest Ref Rng & Units 09/12/2018   11:11 AM 11/07/2013    4:15 PM  CMP  Glucose 65 - 99 mg/dL 76  78   BUN 6 - 24 mg/dL 7  9   Creatinine 0.57 - 1.00 mg/dL 0.82  0.71   Sodium 134 - 144 mmol/L 138  138   Potassium 3.5 - 5.2 mmol/L 4.3  3.8   Chloride 96 - 106 mmol/L 100  101   CO2 20 - 29 mmol/L 23  28   Calcium 8.7 - 10.2 mg/dL 9.1  9.0   Total Protein 6.0 - 8.5 g/dL 7.2  7.2   Total Bilirubin 0.0 - 1.2 mg/dL 0.5  0.5   Alkaline Phos 39 - 117 IU/L 59  65   AST 0 - 40 IU/L 41  25   ALT 0 - 32 IU/L 26  19      ASSESSMENT & PLAN [name] 56 y.o. female with medical history significant for seasonal allergies who presents for evaluation of iron deficiency anemia.   After review of the labs, review of the records, and discussion with the patient the patients findings are most consistent with iron deficiency without anemia.  Today we discussed options moving forward in order to help bolster the patient's ferritin levels.  She notes that she would like to continue p.o. iron therapy and does not wish to pursue IV iron therapy at this point.  She otherwise notes that she is not having  any major menstrual bleeding and her only symptom is moderate fatigue.  At this time we will plan to see the patient back in 4 months time with labs in order to reevaluate and consider IV iron in order to build up her iron stores.  # Iron Deficiency withou Anemia 2/2 to GYN Bleeding -- Findings are consistent with iron deficiency secondary to menstrual bleeding.  --We will confirm iron deficiency anemia by ordering iron panel and ferritin as well as reticulocytes, CBC, and CMP --Continue ferrous sulfate 325 mg daily with a source of vitamin C.  Only recommend once daily dosing. --Today we offered IV iron therapy in order to help bolster her levels.  She noted that she would like to try p.o. iron therapy for the next 4 months and then reevaluate.  We will hold on IV iron therapy at this time. --Plan for return to clinic in 4 months with labs (timeframe per patient request).   Orders Placed This Encounter  Procedures   CBC with Differential ([place] Only)    Standing Status:   Future    Number of Occurrences:   1    Standing Expiration Date:   10/16/2022   CMP ([facility] [facility] only)    Standing Status:   Future    Number of Occurrences:   1    Standing Expiration Date:   10/16/2022   Ferritin    Standing Status:   Future    Number of Occurrences:  1    Standing Expiration Date:   10/16/2022   Iron and Iron Binding Capacity ([facility] only)    Standing Status:   Future    Number of Occurrences:   1    Standing Expiration Date:   10/16/2022   Retic Panel    Standing Status:   Future    Number of Occurrences:   1    Standing Expiration Date:   10/16/2022    All questions were answered. The patient knows to call the clinic with any problems, questions or concerns.  A total of more than 30 minutes were spent on this encounter with face-to-face time and non-face-to-face time, including preparing to see the patient, ordering tests and/or medications, counseling the patient and  coordination of care as outlined above.   Ledell Peoples, MD Department of Hematology/Oncology [place] [facility] Phone: [phone] Pager: [phone] Email: [email or web address]  10/15/2021 2:41 PM

## 2021-10-16 ENCOUNTER — Telehealth: Payer: Self-pay | Admitting: *Deleted

## 2021-10-16 LAB — FERRITIN: Ferritin: 68 ng/mL (ref 11–307)

## 2021-10-16 NOTE — Telephone Encounter (Signed)
Received call back from pt. Reviewed lab results with her.  Advised that Dr. Leonides Schanz is happy with Ferritin of 68 but he does want her to continue her oral iron once daily with a source of vitamin C. Advised that we wil recheck her labs in 4 months. Advised that her appt is October

## 2021-10-16 NOTE — Telephone Encounter (Signed)
-----   Message from Jaci Standard, MD sent at 10/16/2021  8:53 AM EDT ----- Please let [name] know her ferritin is 68. This is a marked improvement. She can continue PO iron. There is no need for IV iron at this time. We will see her back in 4 months as scheduled.   ----- Message ----- From: Leory Plowman, Lab In [place] Sent: 10/15/2021   2:21 PM EDT To: Jaci Standard, MD

## 2021-10-16 NOTE — Telephone Encounter (Signed)
Is October 9 @ 1pm  Pt voiced understanding.

## 2021-10-16 NOTE — Telephone Encounter (Signed)
TCT patient regarding lab results from 10/15/21. No answer and no vm avaiable. Will call pt again later today.

## 2021-10-20 ENCOUNTER — Telehealth: Payer: Self-pay

## 2021-10-20 NOTE — Telephone Encounter (Signed)
T/C from pt asking if you would reconsider IV iron to get her levels up to 100 or higher in reserve. Her last one was at 5. She works out a lot and she is still having more fatigue than she should   Please advise

## 2021-10-22 ENCOUNTER — Telehealth: Payer: Self-pay | Admitting: *Deleted

## 2021-10-22 NOTE — Telephone Encounter (Signed)
Received vm message from pt asking that even though her iron levels have improved on oral iron-she wants to know if she can have 1 iron infusion anyway as she is very physically active.  She thinks 100 mg would be enough...  Please advise

## 2021-10-23 NOTE — Telephone Encounter (Signed)
TCT patient regarding her call yesterday, asking for IV iron. Spoke with her and advised that Dr. Leonides Schanz states she does not meet the criteria for IV iron as her Ferritin is  @68 . Reviewed the range of acceptable Ferritin levels  with her. Also advised that she is not anemic with HGB of 13. Iron saturation is 24%. Advised to continue to take her oral iron and dietary supplement as well. She has shown that the oral iron has made a difference in her iron levels. Advised that we will see her back in October 2023 to recheck her levels

## 2021-10-23 NOTE — Telephone Encounter (Signed)
Pt voiced understanding of the above information.

## 2021-12-03 ENCOUNTER — Ambulatory Visit: Payer: Self-pay

## 2021-12-03 ENCOUNTER — Ambulatory Visit (INDEPENDENT_AMBULATORY_CARE_PROVIDER_SITE_OTHER): Payer: No Typology Code available for payment source | Admitting: Sports Medicine

## 2021-12-03 VITALS — BP 100/56 | Ht 67.0 in | Wt 125.0 lb

## 2021-12-03 DIAGNOSIS — G5701 Lesion of sciatic nerve, right lower limb: Secondary | ICD-10-CM | POA: Diagnosis not present

## 2021-12-03 DIAGNOSIS — S76301S Unspecified injury of muscle, fascia and tendon of the posterior muscle group at thigh level, right thigh, sequela: Secondary | ICD-10-CM

## 2021-12-03 NOTE — Patient Instructions (Signed)
On exam Tenderness over piriformis Sciatic irritation over the sciatic notch Weakness of RT hamstring Weakness of gluteus medius and minimus Weakness of hip rotators including piriformis  Basically your right has shut down and is not firing well  You will make a great strength gain in 6 weeks

## 2021-12-04 DIAGNOSIS — G5701 Lesion of sciatic nerve, right lower limb: Secondary | ICD-10-CM | POA: Insufficient documentation

## 2021-12-04 NOTE — Assessment & Plan Note (Signed)
With global weakness of leg straining hip rotators and hip abductors on the right hip we should be able to make significant improvement in her symptoms with a strengthening program She is highly motivated and I gave her hip abduction and rotation exercises She was given escalating exercises for her hamstring  I discussed medications injections and other options We will recheck this in 6 weeks and if she still having problems I think we should do an ultrasound at that time  Hopefully she will see a very positive response to the home exercise program

## 2021-12-04 NOTE — Progress Notes (Signed)
Chief complaint right hip pain and weakness  Patient has felt that she has had piriformis syndrome for years This may have started when she was running a lot preparing for a marathon She plays highly competitive tennis and this starts to bother her Pain is deep in the right buttocks This sometimes radiates down into her posterior right leg It does not tend to go below the knee  Review of systems She denies any back pain No foot drop or significant weakness of the right leg No numbness tingling or bowel and bladder symptoms  Physical exam Athletic thin female in no acute distress BP (!) 100/56   Ht 5\' 7"  (1.702 m)   Wt 125 lb (56.7 kg)   BMI 19.58 kg/m   She has significant weakness.  Her hip abduction on the right Hip range of motion and joint evaluation is normal Comparison to the left shows significant strength on the left side There is tenderness to palpation noted over the deep buttocks toward the lateral third She gets tenderness when I palpate her sciatic nerve or sciatic notch The greater trochanter is not significantly tender On the right side she also demonstrates significant hamstring weakness There is no sign of atrophy There is no significant limp or change of gait Testing of right hip rotators show significant weakness

## 2022-01-07 ENCOUNTER — Ambulatory Visit: Payer: No Typology Code available for payment source | Admitting: Sports Medicine

## 2022-01-14 ENCOUNTER — Ambulatory Visit (INDEPENDENT_AMBULATORY_CARE_PROVIDER_SITE_OTHER): Payer: No Typology Code available for payment source | Admitting: Sports Medicine

## 2022-01-14 VITALS — BP 98/74 | Ht 67.0 in

## 2022-01-14 DIAGNOSIS — M21611 Bunion of right foot: Secondary | ICD-10-CM | POA: Diagnosis not present

## 2022-01-14 DIAGNOSIS — S76312A Strain of muscle, fascia and tendon of the posterior muscle group at thigh level, left thigh, initial encounter: Secondary | ICD-10-CM

## 2022-01-14 NOTE — Progress Notes (Signed)
  [name] - 56 y.o. female MRN [id number]  Date of birth: [birth year]    CHIEF COMPLAINT:   R Hip follow up    SUBJECTIVE:   HPI:  Pleasant 56 yo female here for follow up of R hip pain/weakness. She was very diligent with her abduction exercises. She says the hip feels much better today. A noticeable difference in strength. She has been able to lightly hit tennis balls without difficulty.   She does report a new symptom today of hamstring tightness in the R hip. Tightness radiates down the posterior aspect of the leg to the posterior knee. She has not tried any exercises for this.   She also reports she has a bunion on R foot. This has been becoming more irritated over the last few months. She has not had custom orthotics. She would like to see a podiatrist.   ROS:     See HPI  PERTINENT  PMH / PSH FH / / SH:  Past Medical, Surgical, Social, and Family History Reviewed & Updated in the EMR.  Pertinent findings include:  none  OBJECTIVE: BP 98/74   Ht 5\' 7"  (1.702 m)   BMI 19.58 kg/m   Physical Exam:  Vital signs are reviewed.  GEN: Alert and oriented, NAD Pulm: Breathing unlabored PSY: normal mood, congruent affect  MSK: R Hip - No deformity or swelling. Nontender over greater trochanter or distally along IT band. Full ROM L hip. 5/5 strength resisted abduction. 4/5 strength R hamstring.   R Foot - bunion on great toe. Hammering of 2nd toe with knuckle pad on 2nd PIP joint.   L Foot - bunion on great toe. Hammering of 2nd toe with knuckle pad on 2nd PIP joint.   ASSESSMENT & PLAN:  1. 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 I expect she will continue to do well. She can return to tennis and all other activities as desired.   2. R Hamstring Strain - hamstring weakness still inhibiting total functional recovery. Given [place] home exercise plan today. Will also eventually add in Nordic exercises. Will have her follow up in 6 weeks to check her  progress. If no improvement, would recommend re-investigating other neurogenic causes of weakness, since she is so motivated and adherent to her home exercises.   3. Bunions of Bilateral Great Toes - referral provided to podiatrist Dr. . Would also consider custom orthotics if desired by the patient in the future.   Logan Bores, MD PGY-4, Sports Medicine Fellow [facility]  I observed and examined the patient with the resident and agree with assessment and plan.  Note reviewed and modified by me. [facility], MD

## 2022-01-27 ENCOUNTER — Ambulatory Visit: Payer: No Typology Code available for payment source | Admitting: Podiatry

## 2022-02-01 ENCOUNTER — Ambulatory Visit (INDEPENDENT_AMBULATORY_CARE_PROVIDER_SITE_OTHER): Payer: No Typology Code available for payment source | Admitting: Podiatry

## 2022-02-01 ENCOUNTER — Ambulatory Visit (INDEPENDENT_AMBULATORY_CARE_PROVIDER_SITE_OTHER): Payer: No Typology Code available for payment source

## 2022-02-01 DIAGNOSIS — M2011 Hallux valgus (acquired), right foot: Secondary | ICD-10-CM | POA: Diagnosis not present

## 2022-02-01 DIAGNOSIS — M2012 Hallux valgus (acquired), left foot: Secondary | ICD-10-CM

## 2022-02-01 DIAGNOSIS — M722 Plantar fascial fibromatosis: Secondary | ICD-10-CM

## 2022-02-02 NOTE — Progress Notes (Signed)
   Chief Complaint  Patient presents with   Bunions   Hammer Toe    New patient here for bunion and hammertoe bilateral feet.    Subjective: 56 y.o. female very healthy and active presenting today for evaluation of minimally symptomatic bunion and hammertoe deformity to the bilateral feet.  Patient states that she has minimal pain and tenderness associated to her feet but she would like to have them evaluated.  She is uncertain if she needs to have surgery to address the bunions although they are mostly asymptomatic.  They have slowly been progressive over the last several years with no recent changes.  We will she presents for further treatment and evaluation  Past Medical History:  Diagnosis Date   Allergy    allergies   Urticaria    Past Surgical History:  Procedure Laterality Date   CESAREAN SECTION  06/1999   CESAREAN SECTION  10/2004   Allergies  Allergen Reactions   Norethin Ace-Eth Estrad-Fe Other (See Comments)       Objective: Physical Exam General: The patient is alert and oriented x3 in no acute distress.  Dermatology: Skin is cool, dry and supple bilateral lower extremities. Negative for open lesions or macerations.  Vascular: Palpable pedal pulses bilaterally. No edema or erythema noted. Capillary refill within normal limits.  Neurological: Epicritic and protective threshold grossly intact bilaterally.   Musculoskeletal Exam: Clinical evidence of bunion deformity noted to the respective foot.  Good range of motion of the first MTPJ with no pain on palpation or range of motion.  Slight lateral deviation of the hallux noted consistent with hallux abductovalgus. Hammertoe contracture also noted on clinical exam to digits #2 of the bilateral foot.  No unit for any significant pain on palpation and range of motion also noted to the metatarsal phalangeal joints of the respective hammertoe digits.    Radiographic Exam: Increased intermetatarsal angle greater than 15  with a hallux abductus angle greater than 30 noted on AP view. Moderate degenerative changes noted within the first MPJ. Contracture deformity also noted to the interphalangeal joints and MPJs of the digits of the respective hammertoes.    Assessment: 1. HAV w/ bunion deformity bilateral; asymptomatic 2. Hammertoe deformity second digit bilateral; asymptomatic   Plan of Care:  1. Patient was evaluated. X-Rays reviewed. 2.  Currently the patient is very active and healthy with minimal pain or symptoms associated to her bunion or hammertoe deformity.  Recommend continued conservative treatment including wide shoe gear and advised against going barefoot. 3.  Also recommend arch supports to support the medial longitudinal arch of the foot and offload pressure from the forefoot 4.  Return to clinic as needed   Edrick Kins, DPM [facility]  Dr. Edrick Kins, [address]                                        [place], [place] [address]                Office [phone]  Fax [phone]

## 2022-02-05 ENCOUNTER — Other Ambulatory Visit: Payer: Self-pay | Admitting: *Deleted

## 2022-02-05 DIAGNOSIS — D500 Iron deficiency anemia secondary to blood loss (chronic): Secondary | ICD-10-CM

## 2022-02-08 ENCOUNTER — Inpatient Hospital Stay: Payer: No Typology Code available for payment source | Attending: Hematology and Oncology

## 2022-02-08 ENCOUNTER — Other Ambulatory Visit: Payer: Self-pay

## 2022-02-08 DIAGNOSIS — D500 Iron deficiency anemia secondary to blood loss (chronic): Secondary | ICD-10-CM

## 2022-02-08 DIAGNOSIS — E611 Iron deficiency: Secondary | ICD-10-CM | POA: Diagnosis present

## 2022-02-08 LAB — CBC WITH DIFFERENTIAL (CANCER CENTER ONLY)
Abs Immature Granulocytes: 0.01 10*3/uL (ref 0.00–0.07)
Basophils Absolute: 0.1 10*3/uL (ref 0.0–0.1)
Basophils Relative: 1 %
Eosinophils Absolute: 0.3 10*3/uL (ref 0.0–0.5)
Eosinophils Relative: 3 %
HCT: 38 % (ref 36.0–46.0)
Hemoglobin: 12.4 g/dL (ref 12.0–15.0)
Immature Granulocytes: 0 %
Lymphocytes Relative: 14 %
Lymphs Abs: 1.2 10*3/uL (ref 0.7–4.0)
MCH: 28.2 pg (ref 26.0–34.0)
MCHC: 32.6 g/dL (ref 30.0–36.0)
MCV: 86.6 fL (ref 80.0–100.0)
Monocytes Absolute: 0.5 10*3/uL (ref 0.1–1.0)
Monocytes Relative: 5 %
Neutro Abs: 6.9 10*3/uL (ref 1.7–7.7)
Neutrophils Relative %: 77 %
Platelet Count: 275 10*3/uL (ref 150–400)
RBC: 4.39 MIL/uL (ref 3.87–5.11)
RDW: 13 % (ref 11.5–15.5)
WBC Count: 8.9 10*3/uL (ref 4.0–10.5)
nRBC: 0 % (ref 0.0–0.2)

## 2022-02-08 LAB — RETIC PANEL
Immature Retic Fract: 13.5 % (ref 2.3–15.9)
RBC.: 4.45 MIL/uL (ref 3.87–5.11)
Retic Count, Absolute: 81.9 10*3/uL (ref 19.0–186.0)
Retic Ct Pct: 1.8 % (ref 0.4–3.1)
Reticulocyte Hemoglobin: 31.3 pg (ref >27.9)

## 2022-02-08 LAB — IRON AND IRON BINDING CAPACITY (CC-WL,HP ONLY)
Iron: 85 ug/dL (ref 28–170)
Saturation Ratios: 29 % (ref 10.4–31.8)
TIBC: 290 ug/dL (ref 250–450)
UIBC: 205 ug/dL (ref 148–442)

## 2022-02-08 LAB — CMP (CANCER CENTER ONLY)
ALT: 14 U/L (ref 0–44)
AST: 23 U/L (ref 15–41)
Albumin: 4 g/dL (ref 3.5–5.0)
Alkaline Phosphatase: 67 U/L (ref 38–126)
Anion gap: 3 — ABNORMAL LOW (ref 5–15)
BUN: 14 mg/dL (ref 6–20)
CO2: 31 mmol/L (ref 22–32)
Calcium: 8.8 mg/dL — ABNORMAL LOW (ref 8.9–10.3)
Chloride: 102 mmol/L (ref 98–111)
Creatinine: 0.74 mg/dL (ref 0.44–1.00)
GFR, Estimated: >60 mL/min (ref >60)
Glucose, Bld: 97 mg/dL (ref 70–99)
Potassium: 4.6 mmol/L (ref 3.5–5.1)
Sodium: 136 mmol/L (ref 135–145)
Total Bilirubin: 0.4 mg/dL (ref 0.3–1.2)
Total Protein: 7.3 g/dL (ref 6.5–8.1)

## 2022-02-08 LAB — FERRITIN: Ferritin: 51 ng/mL (ref 11–307)

## 2022-02-15 ENCOUNTER — Other Ambulatory Visit: Payer: Self-pay

## 2022-02-15 ENCOUNTER — Inpatient Hospital Stay (HOSPITAL_BASED_OUTPATIENT_CLINIC_OR_DEPARTMENT_OTHER): Payer: No Typology Code available for payment source | Admitting: Hematology and Oncology

## 2022-02-15 VITALS — BP 107/73 | HR 63 | Temp 97.3°F | Resp 15 | Wt 129.6 lb

## 2022-02-15 DIAGNOSIS — E611 Iron deficiency: Secondary | ICD-10-CM | POA: Diagnosis not present

## 2022-02-15 DIAGNOSIS — D500 Iron deficiency anemia secondary to blood loss (chronic): Secondary | ICD-10-CM

## 2022-02-15 NOTE — Progress Notes (Signed)
[facility] [phone]   [phone]  PROGRESS NOTE  Patient Care Team: Patient, No Pcp Per as PCP - General (General Practice) Linna Darner, RD as Dietitian (Family Medicine)  Hematological/Oncological History # Iron Deficiency without Anemia 09/12/2018: WBC 7.0, Hgb 13.0, MCV 85 09/03/2021: Ferritin 26.6, WBC 7.2, Hgb 12.9,  MCV 83.9, Plt 287 10/15/2021: establish care with Dr. Leonides Schanz   Interval History:  [name] 56 y.o. female with medical history significant for iron deficiency without anemia who presents for a follow up visit. The patient's last visit was on 10/15/2021. In the interim since the last visit she has continued on p.o. iron therapy without difficulty.  On exam today Ms. Trias reports that she has been doing well.  She notes she is taking iron pills once daily without interruption.  She notes that she takes them on an empty stomach and with water.  She reports that she tolerates it well without any constipation, stomach upset, or nausea.  She reports that she continues to have a regular menstrual cycle each month though it was a little heavier this time around.  She reports that she has been doing her best to try to eat iron rich foods including liver 4 times a week.  She notes that she is sleeping well and not having any issues with nosebleeds, gum bleeding, or dark stools.  She otherwise denies any fevers, chills, sweats, nausea, vomiting or diarrhea.  A full 10 point ROS is listed below.  MEDICAL HISTORY:  Past Medical History:  Diagnosis Date   Allergy    allergies   Urticaria     SURGICAL HISTORY: Past Surgical History:  Procedure Laterality Date   CESAREAN SECTION  06/1999   CESAREAN SECTION  10/2004    SOCIAL HISTORY: Social History   Socioeconomic History   Marital status: Married    Spouse name: Not on file   Number of children: Not on file   Years of education: Not on file   Highest education level: Not on  file  Occupational History   Not on file  Tobacco Use   Smoking status: Never   Smokeless tobacco: Never  Vaping Use   Vaping Use: Never used  Substance and Sexual Activity   Alcohol use: Not Currently   Drug use: Never   Sexual activity: Not on file  Other Topics Concern   Not on file  Social History Narrative   Occupation : housewife   Married   Children 1 boy  1 girl    Caffeine tea/ rare   Never smoked   No alcohol   Social Determinants of Corporate investment banker Strain: Not on file  Food Insecurity: Not on file  Transportation Needs: Not on file  Physical Activity: Not on file  Stress: Not on file  Social Connections: Not on file  Intimate Partner Violence: Not on file    FAMILY HISTORY: Family History  Adopted: Yes    ALLERGIES:  is allergic to norethin ace-eth estrad-fe.  MEDICATIONS:  Current Outpatient Medications  Medication Sig Dispense Refill   calcium-vitamin D (OSCAL WITH D) 500-200 MG-UNIT tablet Take 1-2 tablets by mouth once a week.     cetirizine (ZYRTEC) 10 MG chewable tablet Chew 10 mg by mouth daily.     ferrous sulfate 325 (65 FE) MG tablet Take 650 mg by mouth daily with breakfast.     finasteride (PROSCAR) 5 MG tablet finasteride     Lutein 20 MG  CAPS Take 1-2 capsules by mouth once a week.     Multiple Vitamins-Minerals (MULTIVITAMIN WITH MINERALS) tablet Take 1 tablet by mouth daily.     Omega-3 Fatty Acids (SUPER OMEGA-3) 1000 MG CAPS Take 1-2 capsules by mouth once a week.     vitamin C (ASCORBIC ACID) 250 MG tablet Take 250 mg by mouth daily.     No current facility-administered medications for this visit.    REVIEW OF SYSTEMS:   Constitutional: ( - ) fevers, ( - )  chills , ( - ) night sweats Eyes: ( - ) blurriness of vision, ( - ) double vision, ( - ) watery eyes Ears, nose, mouth, throat, and face: ( - ) mucositis, ( - ) sore throat Respiratory: ( - ) cough, ( - ) dyspnea, ( - ) wheezes Cardiovascular: ( - ) palpitation,  ( - ) chest discomfort, ( - ) lower extremity swelling Gastrointestinal:  ( - ) nausea, ( - ) heartburn, ( - ) change in bowel habits Skin: ( - ) abnormal skin rashes Lymphatics: ( - ) new lymphadenopathy, ( - ) easy bruising Neurological: ( - ) numbness, ( - ) tingling, ( - ) new weaknesses Behavioral/Psych: ( - ) mood change, ( - ) new changes  All other systems were reviewed with the patient and are negative.  PHYSICAL EXAMINATION:  Vitals:   02/15/22 1438  BP: 107/73  Pulse: 63  Resp: 15  Temp: (!) 97.3 F (36.3 C)  SpO2: 99%   Filed Weights   02/15/22 1438  Weight: 129 lb 9.6 oz (58.8 kg)    GENERAL: Well-appearing middle-aged African-American female, alert, no distress and comfortable SKIN: skin color, texture, turgor are normal, no rashes or significant lesions EYES: conjunctiva are pink and non-injected, sclera clear LUNGS: clear to auscultation and percussion with normal breathing effort HEART: regular rate & rhythm and no murmurs and no lower extremity edema Musculoskeletal: no cyanosis of digits and no clubbing  PSYCH: alert & oriented x 3, fluent speech NEURO: no focal motor/sensory deficits  LABORATORY DATA:  I have reviewed the data as listed    Latest Ref Rng & Units 02/08/2022    1:01 PM 10/15/2021    2:08 PM 09/12/2018   11:11 AM  CBC  WBC 4.0 - 10.5 K/uL 8.9  8.3  7.0   Hemoglobin 12.0 - 15.0 g/dL 12.4  12.7  13.0   Hematocrit 36.0 - 46.0 % 38.0  38.7  39.1   Platelets 150 - 400 K/uL 275  242         Latest Ref Rng & Units 02/08/2022    1:01 PM 10/15/2021    2:08 PM 09/12/2018   11:11 AM  CMP  Glucose 70 - 99 mg/dL 97  83  76   BUN 6 - 20 mg/dL 14  17  7    Creatinine 0.44 - 1.00 mg/dL 0.74  0.80  0.82   Sodium 135 - 145 mmol/L 136  139  138   Potassium 3.5 - 5.1 mmol/L 4.6  4.1  4.3   Chloride 98 - 111 mmol/L 102  103  100   CO2 22 - 32 mmol/L 31  31  23    Calcium 8.9 - 10.3 mg/dL 8.8  9.4  9.1   Total Protein 6.5 - 8.1 g/dL 7.3  7.6  7.2    Total Bilirubin 0.3 - 1.2 mg/dL 0.4  0.5  0.5   Alkaline Phos 38 - 126 U/L 67  66  59   AST 15 - 41 U/L 23  32  41   ALT 0 - 44 U/L 14  19  26      RADIOGRAPHIC STUDIES: DG Foot Complete Right  Result Date: 02/01/2022 Please see detailed radiograph report in office note.  DG Foot Complete Left  Result Date: 02/01/2022 Please see detailed radiograph report in office note.   ASSESSMENT & PLAN [name] 56 y.o. female with medical history significant for iron deficiency without anemia who presents for a follow up visit.   # Iron Deficiency withou Anemia 2/2 to GYN Bleeding -- Findings are consistent with iron deficiency secondary to menstrual bleeding.  -- Today we will assess the status of her iron deficiency by ordering iron panel and ferritin as well as reticulocytes, CBC, and CMP --Continue ferrous sulfate 325 mg daily with a source of vitamin C.  Only recommend once daily dosing. -- Previously we offered IV iron therapy in order to help bolster her levels.  She noted that she would like to continue with p.o. iron therapy and iron rich diet. --Labs today show white blood cell count 8.9, hemoglobin 12.4, MCV 86.6, and platelets of 275. --Plan for return to clinic in 6 months with labs    No orders of the defined types were placed in this encounter.   All questions were answered. The patient knows to call the clinic with any problems, questions or concerns.  A total of more than 25 minutes were spent on this encounter with face-to-face time and non-face-to-face time, including preparing to see the patient, ordering tests and/or medications, counseling the patient and coordination of care as outlined above.   59, MD Department of Hematology/Oncology [facility] at [facility] Phone: [phone] Pager: [phone] Email: [email or web address]  02/16/2022 3:27 PM

## 2022-02-25 ENCOUNTER — Ambulatory Visit (INDEPENDENT_AMBULATORY_CARE_PROVIDER_SITE_OTHER): Payer: No Typology Code available for payment source | Admitting: Sports Medicine

## 2022-02-25 VITALS — BP 106/68 | Ht 67.0 in | Wt 128.0 lb

## 2022-02-25 DIAGNOSIS — M25561 Pain in right knee: Secondary | ICD-10-CM

## 2022-02-25 NOTE — Progress Notes (Signed)
[name] - 56 y.o. female MRN [id number]  Date of birth: 09/25/1965    CHIEF COMPLAINT:   Right knee pain    SUBJECTIVE:   HPI:  Patient was going today to be evaluated for right knee pain.    She previously seen here for hamstring strain.  She has been doing her Nordic exercises and quite pleased with a hamstring strength.  Today she reports some right knee pain.  This been going on for several weeks.  She notices when she plays tennis she sometimes get a twinge in the lateral aspect of her knee.  It is a sharp shooting pain that goes away.  She has not noticed any swelling of the knee.  Denies any catching or locking the knee.  She has tried icing and stretching.  Her husband thought she might have a degenerative meniscal tear.  She comes here today for evaluation of that.  She also has a history of left ankle sprain's.  She asks for advice on how to prevent recurrent left ankle sprains in the future.  ROS:     See HPI  PERTINENT  PMH / PSH FH / / SH:  Past Medical, Surgical, Social, and Family History Reviewed & Updated in the EMR.  Pertinent findings include:  none  OBJECTIVE: BP 106/68   Ht 5\' 7"  (1.702 m)   Wt 128 lb (58.1 kg)   BMI 20.05 kg/m   Physical Exam:  Vital signs are reviewed.  GEN: Alert and oriented, NAD Pulm: Breathing unlabored PSY: normal mood, congruent affect  MSK: Right knee - Inspection: no gross deformity b/l. No swelling/effusion, erythema or bruising b/l. Skin intact - Palpation: no TTP b/l - ROM: full active ROM with flexion and extension in knee and hip b/l - Strength: 5/5 strength b/l - Neuro/vasc: NV intact distally b/l - Special Tests: - LIGAMENTS: negative anterior and posterior drawer, negative Lachman's, no MCL or LCL laxity  -- MENISCUS: negative McMurray's, negative Thessaly  -- PF JOINT: nml patellar mobility bilaterally.  negative patellar grind, negative patellar apprehension  Left ankle -no gross deformity.  No  swelling/effusion, erythema, or bruising's.  No tenderness to palpation.  Full range of motion in all directions 5/5 strength throughout.  1+ anterior drawer test.  1+ talar tilt test.  Negative Klieger test.  ULTRASOUND: Right knee MSK ultrasound knee: Images were obtained both in the transverse and longitudinal plane. Patellar and quadriceps tendons were well visualized with no abnormalities. No effusion. Medial and lateral menisci were well visualized with no abnormalities. There is a small hypoechoic fluid collection near the lateral joint space near the femoral condyle just inferior to the IT band.  Impression: IT band impingement at the lateral femoral condyle.  Otherwise unremarkable knee exam  Ultrasound and interpretation by Dr. and Jordan Hawks. Fields, MD    ASSESSMENT & PLAN:  1.  Right knee pain, likely secondary to IT band impingement -Overall exam and ultrasound and clinical picture reassuring.  No effusion or point was any intra-articular pathology.  She does have some fluid near the lateral femoral condyle I think represents IT band impingement, likely from tennis.  She was instructed on IT band exercises and biomechanics adjustments that can be made to her back hand tennis swing to try to improve her form and reduce her symptoms.  She is excited to work on this.  General reassurance was provided.  All questions were answered she agrees to plan.  2.  History of left lateral  ankle sprain -Continue bracing.  Instructed on single-leg balance, single-leg reach downs, and eccentric and isometric exercises for increasing stabilization proprioception at the ankle.  Dortha Kern, MD PGY-4, Sports Medicine Fellow [place]  I observed and examined the patient with the [facility] resident and agree with assessment and plan.  Note reviewed and modified by me. Ila Mcgill, MD

## 2022-04-17 IMAGING — MG MM DIGITAL SCREENING BILAT W/ TOMO AND CAD
8 series · 9 of 24 positions shown · non-contrast
Comparison: Previous exam(s).

CLINICAL DATA: Screening.

EXAM:
DIGITAL SCREENING BILATERAL MAMMOGRAM WITH TOMOSYNTHESIS AND CAD
TECHNIQUE: Bilateral screening digital craniocaudal and mediolateral oblique
mammograms were obtained. Bilateral screening digital breast
tomosynthesis was performed. The images were evaluated with
computer-aided detection.

[R MLO synth-2D]
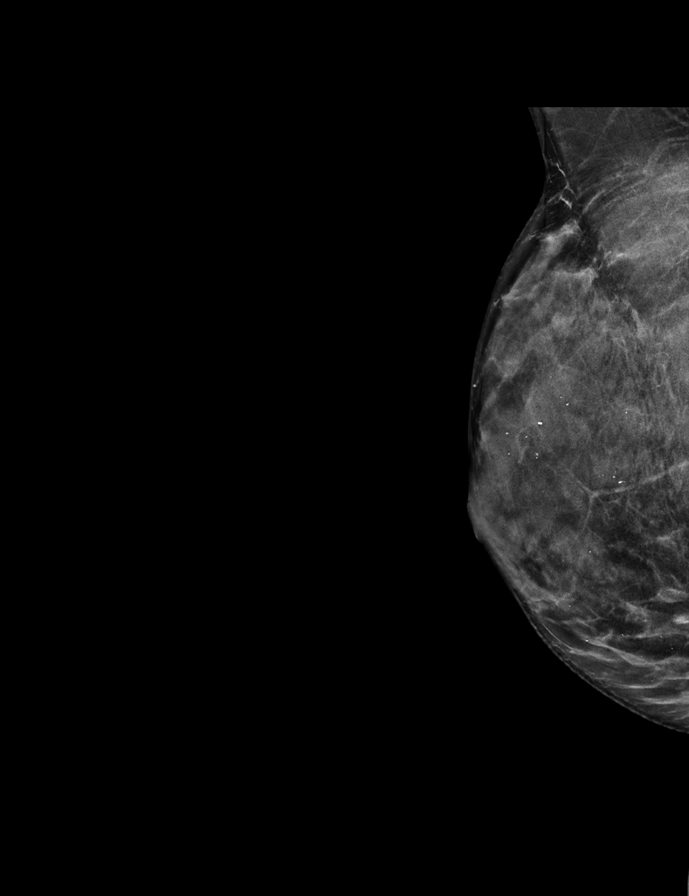

[L MLO synth-2D]
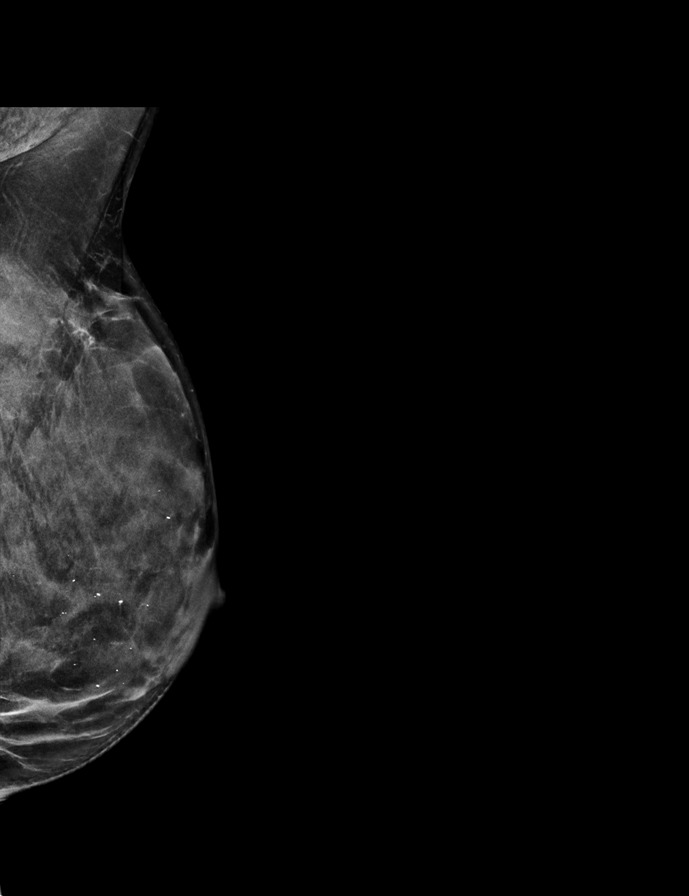

[R CC synth-2D]
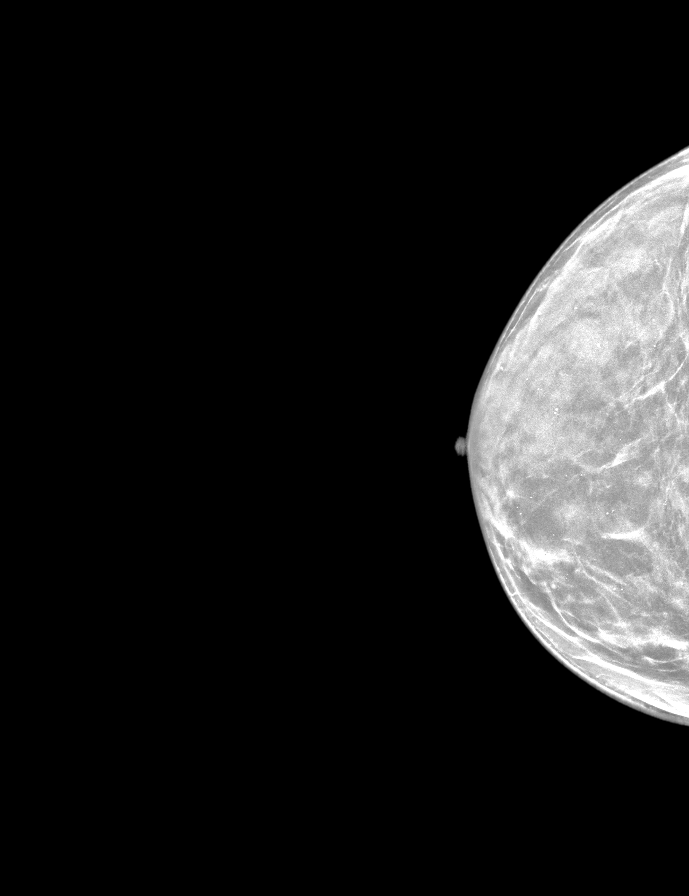

[L CC synth-2D]
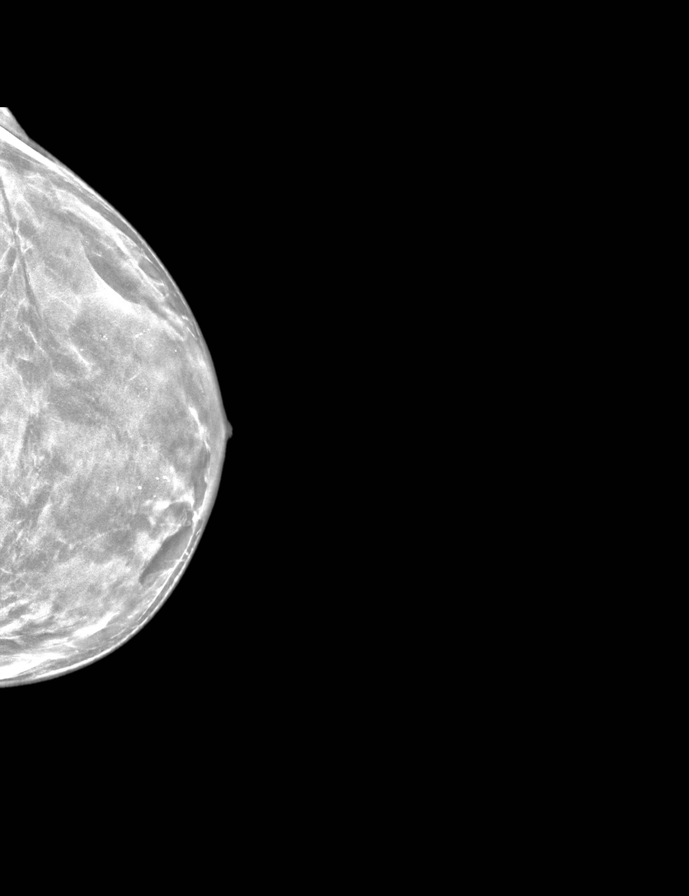

[L CC tomo · 2 of 59 frames shown]
[frame 20/59]
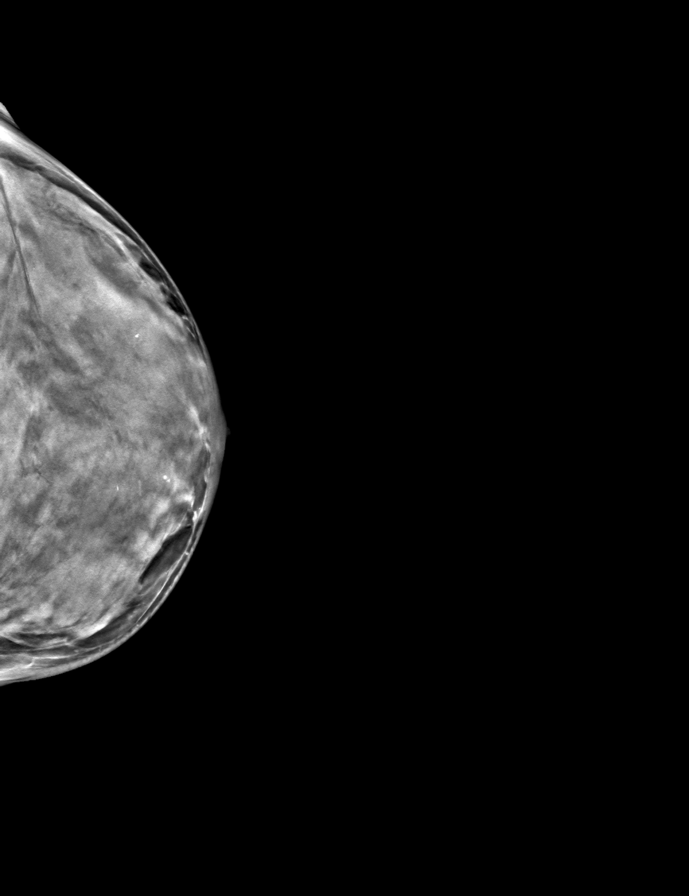
[frame 30/59]
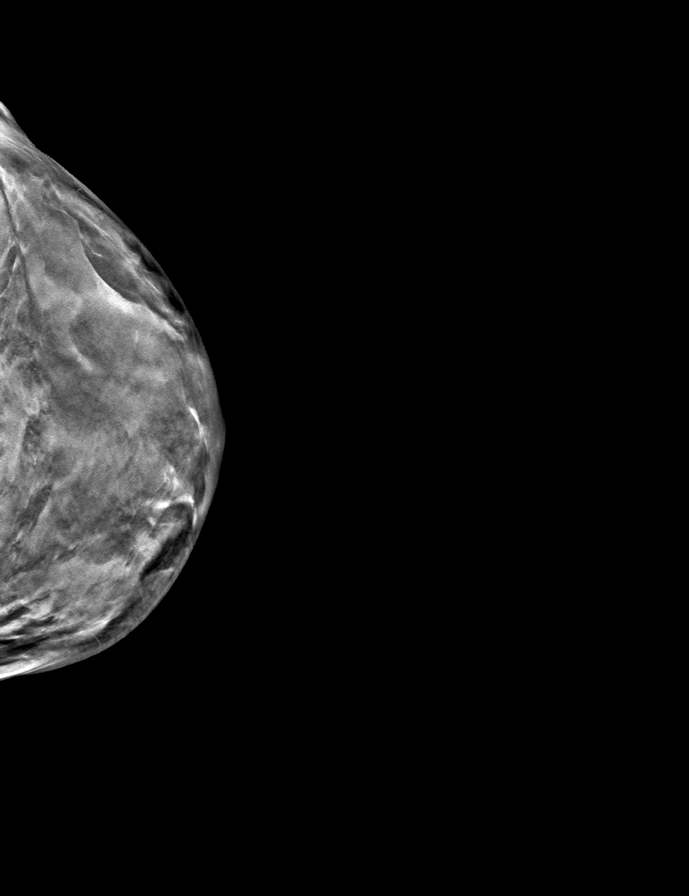

[L MLO tomo · tomo slice 25/50.0]
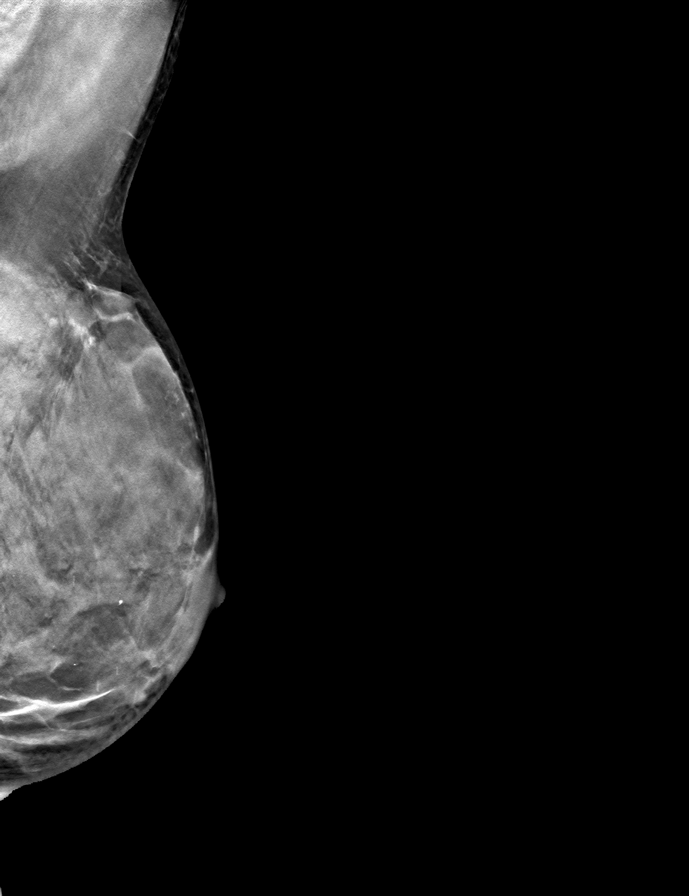

[R CC tomo · tomo slice 29/56.0]
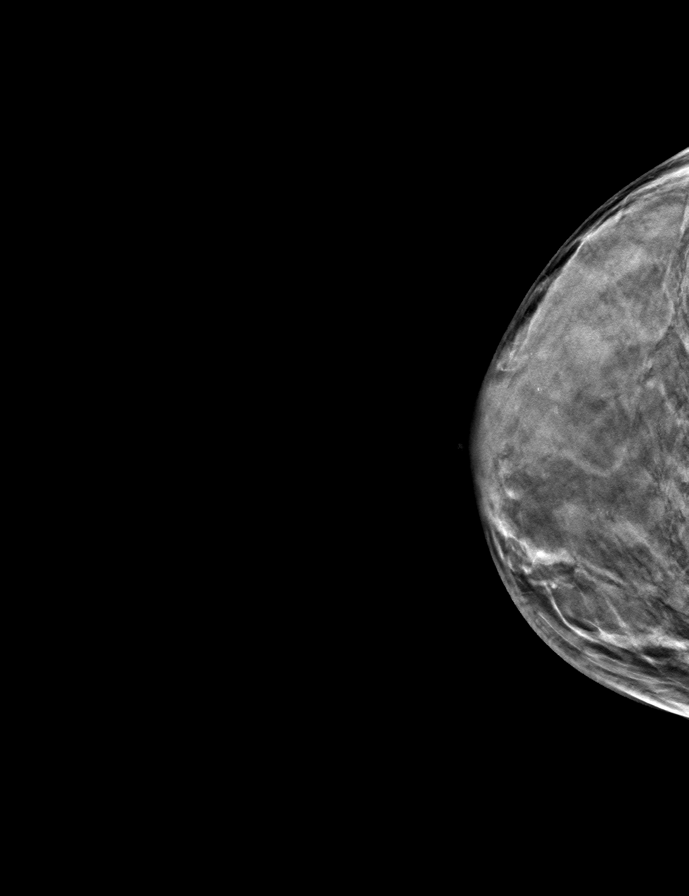

[R MLO tomo · tomo slice 27/54.0]
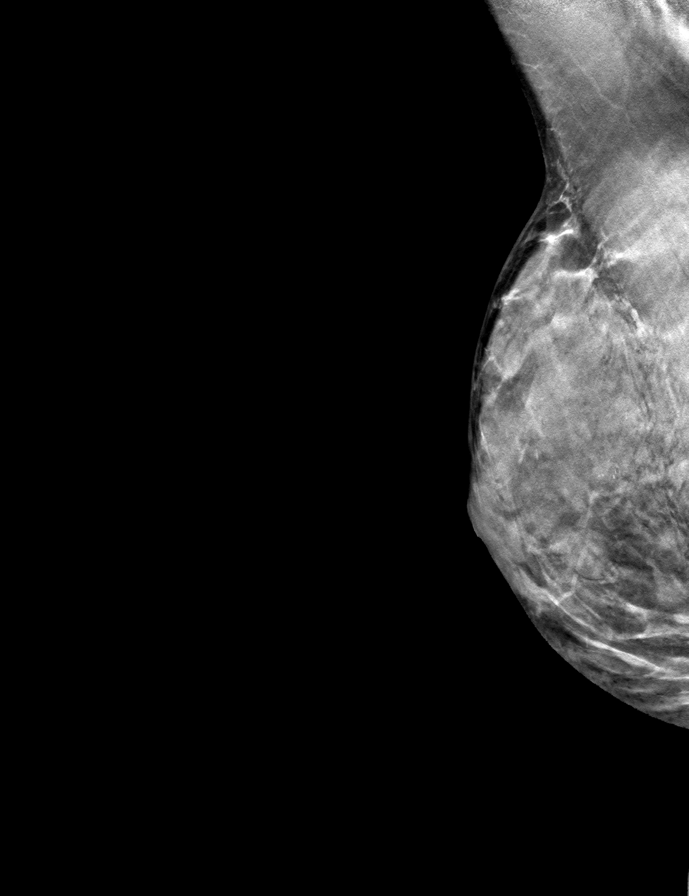

[9 of 24 positions shown; findings below may reference images not displayed]

ACR Breast Density Category d: The breast tissue is extremely dense,
which lowers the sensitivity of mammography
FINDINGS: There are no findings suspicious for malignancy.
IMPRESSION: No mammographic evidence of malignancy. A result letter of this
screening mammogram will be mailed directly to the patient.

RECOMMENDATION:
Screening mammogram in one year. (Code:TA-V-WV9)

BI-RADS CATEGORY  1: Negative.

## 2022-08-16 ENCOUNTER — Telehealth: Payer: Self-pay | Admitting: Hematology and Oncology

## 2022-08-16 NOTE — Telephone Encounter (Signed)
Attempted to call patient to confirm cancelling 4/17 appointments from 4/15 staff message. No voicemail set up. Will attempt to call back again to reschedule.

## 2022-08-18 ENCOUNTER — Inpatient Hospital Stay: Payer: No Typology Code available for payment source | Admitting: Hematology and Oncology

## 2022-08-18 ENCOUNTER — Inpatient Hospital Stay: Payer: No Typology Code available for payment source

## 2022-08-18 ENCOUNTER — Other Ambulatory Visit: Payer: No Typology Code available for payment source

## 2022-08-18 ENCOUNTER — Ambulatory Visit: Payer: No Typology Code available for payment source | Admitting: Hematology and Oncology

## 2023-01-25 NOTE — Progress Notes (Unsigned)
Electrophysiology Office Note:    Date:  01/26/2023   ID:  [name], DOB Mar 24, 1966, MRN [id number]  [facility] Cardiologist:  None  [facility] Electrophysiologist:  None   Referring MD: No ref. provider found   Chief Complaint: Syncope  History of Present Illness:    [name] is a 57 y.o. femalewho I am seeing today for an evaluation of syncope.  Her past medical history includes iron deficiency and seasonal allergies.  She is recently experienced several episodes of syncope.  She is a very active woman.  She plays tennis multiple times per week without limitation.  All of her prior lightheadedness and near syncopal episodes occurred in the setting of dehydration, iron deficiency and standing from a seated position.  She describes 1 when she stood from a very hot bath and walked into the restroom.  When she got to the restroom her vision became blurry and she had to lay down on the ground to recover.  Another episode she was laying in bed and stood up to go talk to her husband in the bathroom.  When she got to the bathroom she felt lightheaded and laid down on the floor again with symptom recovery.  This was in the setting of being n.p.o. for an endoscopy.  She has never had abrupt syncope.        Their past medical, social and family history was reveiwed.   ROS:   Please see the history of present illness.    All other systems reviewed and are negative.  EKGs/Labs/Other Studies Reviewed:    The following studies were reviewed today:  November 15, 2014 exercise tolerance test Extremely high exercise tolerance  EKG Interpretation Date/Time:  Wednesday January 26 2023 08:39:19 EDT Ventricular Rate:  41 PR Interval:  140 QRS Duration:  86 QT Interval:  476 QTC Calculation: 392 R Axis:   83  Text Interpretation: Marked sinus bradycardia Confirmed by Steffanie Dunn [phone]) on 01/26/2023 8:44:39 AM    Physical Exam:    VS:  BP 106/70   Pulse (!)  41   Ht 5\' 7"  (1.702 m)   Wt 130 lb (59 kg)   SpO2 100%   BMI 20.36 kg/m     Wt Readings from Last 3 Encounters:  01/26/23 130 lb (59 kg)  02/25/22 128 lb (58.1 kg)  02/15/22 129 lb 9.6 oz (58.8 kg)     GEN:  Well nourished, well developed in no acute distress.  Thin CARDIAC: Regular rhythm, bradycardic, no murmurs, rubs, gallops RESPIRATORY:  Clear to auscultation without rales, wheezing or rhonchi       ASSESSMENT AND PLAN:    1. Syncope and collapse     #Syncope #Orthostatic hypotension #Sinus bradycardia The patient has recurrent episodes of near syncope secondary to orthostatic hypotension/intolerance.  I do not think her symptoms are concerning for arrhythmic syncope.  I do not think she needs to restrict her driving.  She should maintain an adequate level of hydration and not skip any meals.  She should take changes in position slowly.  She should take all prodromal symptoms seriously and get to the ground immediately.  She understands that if her syncope prodrome were to change at all she should immediately reach out for an appointment.  There is no current indication for permanent pacing.  Given her level of conditioning, her sinus rate is appropriate.  She has an appropriate chronotropic response to exercise.  She can follow-up with me on an as-needed basis.  Signed, Rossie Muskrat. Lalla Brothers, MD, [facility], [facility] 01/26/2023 8:58 AM    Electrophysiology [facility]

## 2023-01-26 ENCOUNTER — Encounter: Payer: Self-pay | Admitting: Cardiology

## 2023-01-26 ENCOUNTER — Ambulatory Visit: Payer: No Typology Code available for payment source | Attending: Cardiology | Admitting: Cardiology

## 2023-01-26 VITALS — BP 106/70 | HR 41 | Ht 67.0 in | Wt 130.0 lb

## 2023-01-26 DIAGNOSIS — R55 Syncope and collapse: Secondary | ICD-10-CM

## 2023-01-26 DIAGNOSIS — I951 Orthostatic hypotension: Secondary | ICD-10-CM

## 2023-01-26 NOTE — Patient Instructions (Signed)
Medication Instructions:  Your physician recommends that you continue on your current medications as directed. Please refer to the Current Medication list given to you today.  *If you need a refill on your cardiac medications before your next appointment, please call your pharmacy*  Follow-Up: At [facility], you and your health needs are our priority.  As part of our continuing mission to provide you with exceptional heart care, we have created designated Provider Care Teams.  These Care Teams include your primary Cardiologist (physician) and Advanced Practice Providers (APPs -  Physician Assistants and Nurse Practitioners) who all work together to provide you with the care you need, when you need it.  Your next appointment:   As needed with Dr. Lalla Brothers

## 2023-06-07 ENCOUNTER — Ambulatory Visit: Payer: No Typology Code available for payment source | Admitting: Sports Medicine

## 2023-07-05 ENCOUNTER — Other Ambulatory Visit: Payer: Self-pay | Admitting: Obstetrics and Gynecology

## 2023-07-05 DIAGNOSIS — Z1231 Encounter for screening mammogram for malignant neoplasm of breast: Secondary | ICD-10-CM

## 2023-07-12 ENCOUNTER — Ambulatory Visit
Admission: RE | Admit: 2023-07-12 | Discharge: 2023-07-12 | Disposition: A | Discharge status: Home / Self Care | Source: Ambulatory Visit | Attending: Obstetrics and Gynecology | Admitting: Obstetrics and Gynecology

## 2023-07-12 DIAGNOSIS — Z1231 Encounter for screening mammogram for malignant neoplasm of breast: Secondary | ICD-10-CM

## 2023-08-08 ENCOUNTER — Other Ambulatory Visit (HOSPITAL_COMMUNITY): Payer: Self-pay | Admitting: Family Medicine

## 2023-08-08 DIAGNOSIS — E785 Hyperlipidemia, unspecified: Secondary | ICD-10-CM

## 2023-08-18 ENCOUNTER — Ambulatory Visit (HOSPITAL_COMMUNITY)
Admission: RE | Admit: 2023-08-18 | Discharge: 2023-08-18 | Disposition: A | Discharge status: Home / Self Care | Payer: Self-pay | Source: Ambulatory Visit | Attending: Family Medicine | Admitting: Family Medicine

## 2023-08-18 DIAGNOSIS — E785 Hyperlipidemia, unspecified: Secondary | ICD-10-CM | POA: Insufficient documentation

## 2023-08-31 ENCOUNTER — Ambulatory Visit (INDEPENDENT_AMBULATORY_CARE_PROVIDER_SITE_OTHER): Admitting: Podiatry

## 2023-08-31 ENCOUNTER — Encounter: Payer: Self-pay | Admitting: Podiatry

## 2023-08-31 DIAGNOSIS — M2011 Hallux valgus (acquired), right foot: Secondary | ICD-10-CM

## 2023-08-31 DIAGNOSIS — M2012 Hallux valgus (acquired), left foot: Secondary | ICD-10-CM | POA: Diagnosis not present

## 2023-08-31 NOTE — Progress Notes (Signed)
   Chief Complaint  Patient presents with   Foot Pain    RM#8 Right foot pain at ball of foot no injuries just painful while walking left foot bunion discussion previous x rays 02/2023.    Subjective: 58 y.o. female very healthy and active presenting today for follow-up evaluation of bunion and hammertoes to the bilateral feet.  Most recently she has been developing some pain to the second MTP of the right foot.  She notices some achiness for the first 10 minutes of activity and then it seems to go away.  She presents for further treatment and evaluation  Past Medical History:  Diagnosis Date   Allergy     allergies   Urticaria    Past Surgical History:  Procedure Laterality Date   CESAREAN SECTION  06/1999   CESAREAN SECTION  10/2004   Allergies  Allergen Reactions   Norethin Ace-Eth Estrad-Fe Other (See Comments)       Objective: Physical Exam General: The patient is alert and oriented x3 in no acute distress.  Dermatology: Skin is cool, dry and supple bilateral lower extremities. Negative for open lesions or macerations.  Vascular: Palpable pedal pulses bilaterally. No edema or erythema noted. Capillary refill within normal limits.  Neurological: Grossly intact via light touch  Musculoskeletal Exam: Clinical evidence of bunion deformity noted to the respective foot.  Good range of motion of the first MTPJ with no pain on palpation or range of motion.  Slight lateral deviation of the hallux noted consistent with hallux abductovalgus. Hammertoe contracture also noted on clinical exam to digits #2 of the bilateral foot.  No unit for any significant pain on palpation and range of motion also noted to the metatarsal phalangeal joints of the respective hammertoe digits.  There is some tenderness with palpation to the second TP of the right foot  Radiographic Exam B/L feet 08/31/2023: Increased intermetatarsal angle and hallux abductus angle noted consistent with hallux valgus  deformity.  Hammertoe contracture also noted specifically the second digit    Assessment: 1. HAV w/ bunion deformity bilateral; asymptomatic 2. Hammertoe deformity second digit bilateral; asymptomatic 3.  Second MTP capsulitis right  Plan of Care:  -Patient was evaluated. X-Rays reviewed. -Discussed conservative care to alleviate pressure from the second MTP of the right foot. -Offloading quarter inch felt metatarsal pads were provided for the patient to apply to the insoles of her shoes -I also believe the patient would benefit from arch supports to support the medial longitudinal arch of the foot and offload pressure from the forefoot -Return to clinic as needed  Dot Gazella, DPM [facility]  Dr. Dot Gazella, DPM    [address]                                        [place], [place] [address]                Office [phone]  Fax [phone]

## 2023-10-05 ENCOUNTER — Ambulatory Visit: Admitting: Podiatry

## 2023-10-19 ENCOUNTER — Other Ambulatory Visit: Payer: Self-pay | Admitting: Gastroenterology

## 2024-02-01 ENCOUNTER — Telehealth (HOSPITAL_COMMUNITY): Payer: Self-pay | Admitting: *Deleted

## 2024-02-01 NOTE — Telephone Encounter (Signed)
 [name] was scheduled for anal rectal manometry (Procedure) with Dr. saintclair on 10/8 (date), at [facility].   Patient/or family called on 09/30 (date) to cancel their procedure due to wanted to cancel (reason)  Dr saintclair & office notified. Patient instructed to call physician's office to reschedule their procedure. Patient demonstrated understanding.

## 2024-02-08 ENCOUNTER — Ambulatory Visit (HOSPITAL_COMMUNITY): Admit: 2024-02-08 | Admitting: Gastroenterology

## 2024-02-08 ENCOUNTER — Encounter (HOSPITAL_COMMUNITY): Payer: Self-pay

## 2024-02-08 SURGERY — MANOMETRY, ANORECTAL
